# Patient Record
Sex: Female | Born: 1935
Health system: Southern US, Community
[De-identification: ages and names within clinical notes are randomized; demographics above are authoritative.]

## PROBLEM LIST (undated history)

## (undated) DIAGNOSIS — M8580 Other specified disorders of bone density and structure, unspecified site: Secondary | ICD-10-CM

## (undated) DIAGNOSIS — K219 Gastro-esophageal reflux disease without esophagitis: Secondary | ICD-10-CM

## (undated) DIAGNOSIS — Z85828 Personal history of other malignant neoplasm of skin: Secondary | ICD-10-CM

## (undated) DIAGNOSIS — M4800 Spinal stenosis, site unspecified: Secondary | ICD-10-CM

## (undated) DIAGNOSIS — K5792 Diverticulitis of intestine, part unspecified, without perforation or abscess without bleeding: Secondary | ICD-10-CM

## (undated) DIAGNOSIS — Z9289 Personal history of other medical treatment: Secondary | ICD-10-CM

## (undated) DIAGNOSIS — I639 Cerebral infarction, unspecified: Secondary | ICD-10-CM

## (undated) DIAGNOSIS — I10 Essential (primary) hypertension: Secondary | ICD-10-CM

## (undated) DIAGNOSIS — S72009A Fracture of unspecified part of neck of unspecified femur, initial encounter for closed fracture: Secondary | ICD-10-CM

## (undated) DIAGNOSIS — E785 Hyperlipidemia, unspecified: Secondary | ICD-10-CM

## (undated) DIAGNOSIS — H9190 Unspecified hearing loss, unspecified ear: Secondary | ICD-10-CM

## (undated) DIAGNOSIS — R001 Bradycardia, unspecified: Secondary | ICD-10-CM

## (undated) HISTORY — DX: Cerebral infarction, unspecified: I63.9

## (undated) HISTORY — DX: Fracture of unspecified part of neck of unspecified femur, initial encounter for closed fracture: S72.009A

## (undated) HISTORY — PX: LAPAROSCOPIC GASTRIC BANDING: SHX1100

## (undated) HISTORY — DX: Bradycardia, unspecified: R00.1

## (undated) HISTORY — DX: Diverticulitis of intestine, part unspecified, without perforation or abscess without bleeding: K57.92

## (undated) HISTORY — PX: TONSILLECTOMY AND ADENOIDECTOMY: SUR1326

## (undated) HISTORY — PX: ABDOMINAL HYSTERECTOMY: SHX81

## (undated) HISTORY — DX: Spinal stenosis, site unspecified: M48.00

## (undated) HISTORY — DX: Gastro-esophageal reflux disease without esophagitis: K21.9

## (undated) HISTORY — DX: Hyperlipidemia, unspecified: E78.5

## (undated) HISTORY — PX: CHOLECYSTECTOMY: SHX55

## (undated) HISTORY — PX: OTHER SURGICAL HISTORY: SHX169

## (undated) HISTORY — DX: Essential (primary) hypertension: I10

## (undated) HISTORY — DX: Unspecified hearing loss, unspecified ear: H91.90

## (undated) HISTORY — DX: Personal history of other malignant neoplasm of skin: Z85.828

## (undated) HISTORY — DX: Personal history of other medical treatment: Z92.89

## (undated) HISTORY — PX: APPENDECTOMY: SHX54

## (undated) HISTORY — DX: Other specified disorders of bone density and structure, unspecified site: M85.80

---

## 1997-09-13 DIAGNOSIS — I639 Cerebral infarction, unspecified: Secondary | ICD-10-CM

## 1997-09-13 HISTORY — DX: Cerebral infarction, unspecified: I63.9

## 2001-02-15 ENCOUNTER — Encounter (INDEPENDENT_AMBULATORY_CARE_PROVIDER_SITE_OTHER): Payer: Self-pay | Admitting: Specialist

## 2001-02-15 ENCOUNTER — Ambulatory Visit (HOSPITAL_COMMUNITY): Admission: RE | Admit: 2001-02-15 | Discharge: 2001-02-15 | Payer: Self-pay | Admitting: Gastroenterology

## 2003-01-23 ENCOUNTER — Inpatient Hospital Stay (HOSPITAL_COMMUNITY): Admission: EM | Admit: 2003-01-23 | Discharge: 2003-01-24 | Payer: Self-pay | Admitting: Emergency Medicine

## 2003-01-23 ENCOUNTER — Encounter: Payer: Self-pay | Admitting: *Deleted

## 2003-01-24 ENCOUNTER — Encounter: Payer: Self-pay | Admitting: *Deleted

## 2005-06-29 ENCOUNTER — Emergency Department (HOSPITAL_COMMUNITY): Admission: EM | Admit: 2005-06-29 | Discharge: 2005-06-29 | Payer: Self-pay | Admitting: Emergency Medicine

## 2005-10-13 ENCOUNTER — Inpatient Hospital Stay (HOSPITAL_COMMUNITY): Admission: EM | Admit: 2005-10-13 | Discharge: 2005-10-15 | Payer: Self-pay | Admitting: Emergency Medicine

## 2006-11-03 ENCOUNTER — Encounter: Payer: Self-pay | Admitting: Internal Medicine

## 2006-11-03 ENCOUNTER — Encounter: Admission: RE | Admit: 2006-11-03 | Discharge: 2006-11-03 | Payer: Self-pay | Admitting: Specialist

## 2006-11-10 ENCOUNTER — Encounter: Admission: RE | Admit: 2006-11-10 | Discharge: 2006-11-10 | Payer: Self-pay | Admitting: Specialist

## 2006-11-10 IMAGING — RF IR DG VERTEBROPLASTY FL
10 series · 10 of 10 positions shown · non-contrast
Comparison: none

CLINICAL DATA: 82 year old female with T12 osteoporotic compression fracture. 
 T12 VERTEBROPLASTY: 
 Operator:  PIINEDA, M.D.
 Anesthesia:  Moderate sedation was provided with Versed and Fentanyl.  The patient was given 1 gram Ancef for IV antibiotic prophylaxis. 
 Preoperative Diagnosis:  Osteoporotic compression fracture of T12.  The fracture was sustained with minimal trauma.  Of note, the patient has six lumbar type vertebral bodies.  I believe the last is a transitional S1 segment.   
 Post Operative Diagnosis:  Osteoporotic compression fracture of T12. 
 Procedure:  After obtaining informed written for the procedure and sedation, the patient was brought to Fluoroscopy.   She stated her pain had subsided some over the last week, but she continues to have pain with movement and sleeping which makes her hesitant to pursue activities.  
 She was positioned on the table prone.  The T12 vertebral body was localized.  The back was prepped and draped in the usual sterile fashion.  The left pedicle of T12 was localized.  The superficial soft tissues were anesthetized with 1% Lidocaine to the level of the pedicle.  A small skin incision was made.  A 13 gauge bone needle was then advanced through the pedicle to the anterior one-third of the vertebral body approaching the midline.  The cement was mixed to an appropriate viscosity.  The cement was then instilled via the pedicle needle in situ.  An excellent trabecular filling pattern was achieved.  There was filling across the midline.  The cement extends from the superior to inferior endplate on the left.  It does not reach the superior endplate to the same extent on the right.  There is minimal vascular extrusion anteriorly.  No significant extrusion is seen elsewhere.  
 The needle was then removed.  The patient remained prone for approximately 10 minutes before being rolled supine.  She was observed for an hour an a half prior to discharge in stable neurologic condition.

[Series 1: vertebro  plasty · 1 of 1 slices shown (1 of 10)]
[im 1/1]
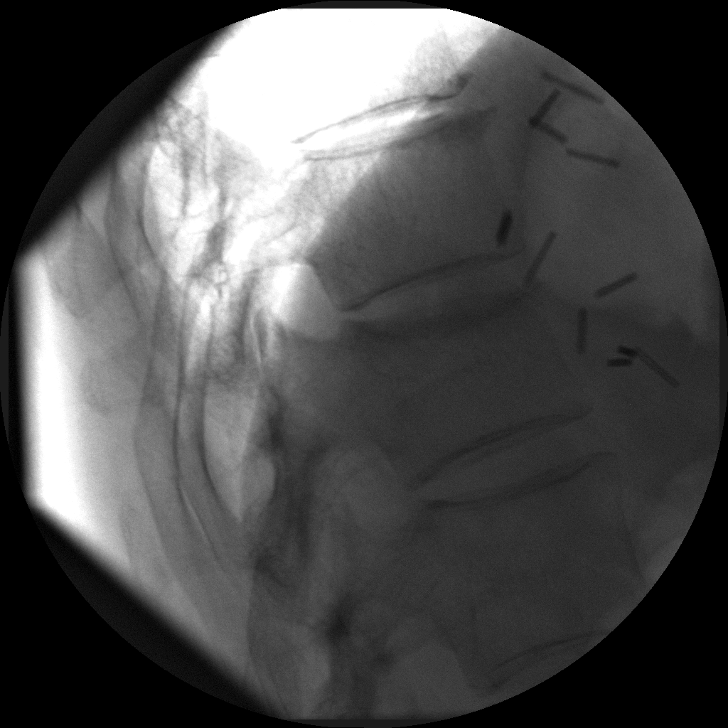

[Series 2: vertebro  plasty · 1 of 1 slices shown (2 of 10)]
[im 1/1]
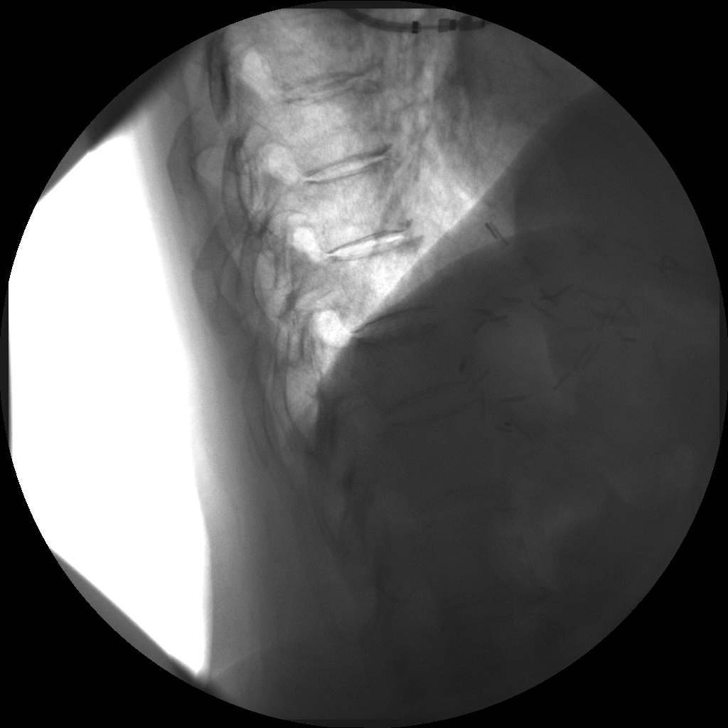

[Series 3: vertebro  plasty · 1 of 1 slices shown (3 of 10)]
[im 1/1]
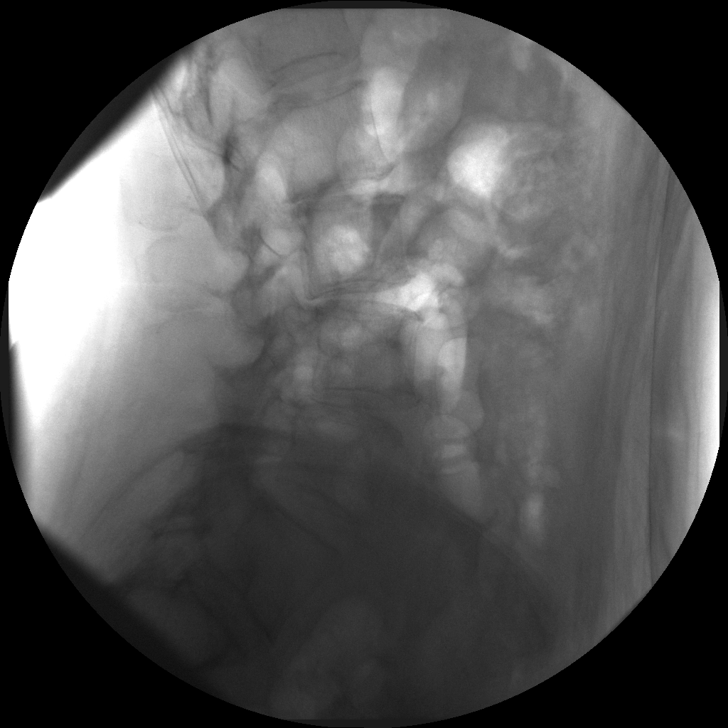

[Series 8: vertebro  plasty · 1 of 1 slices shown (4 of 10)]
[im 1/1]
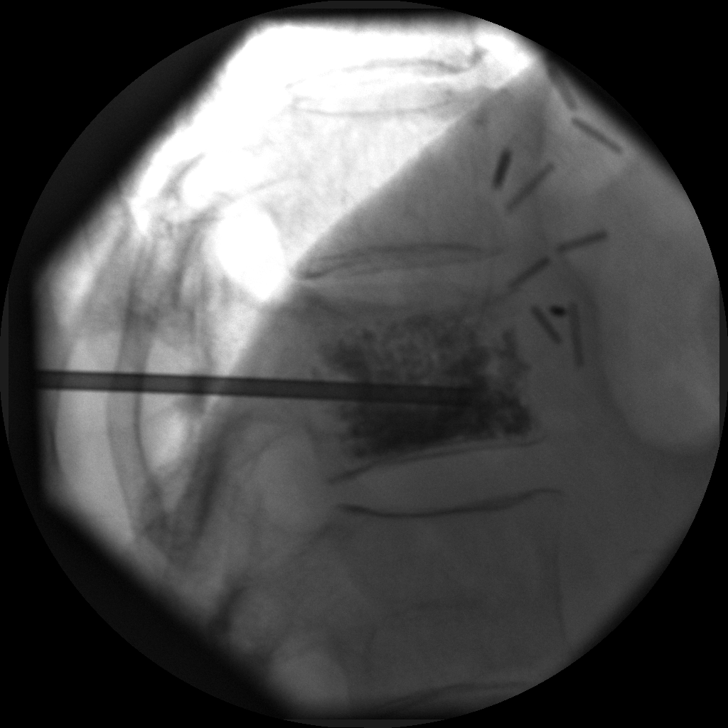

[Series 9: vertebro  plasty · 1 of 1 slices shown (5 of 10)]
[im 1/1]
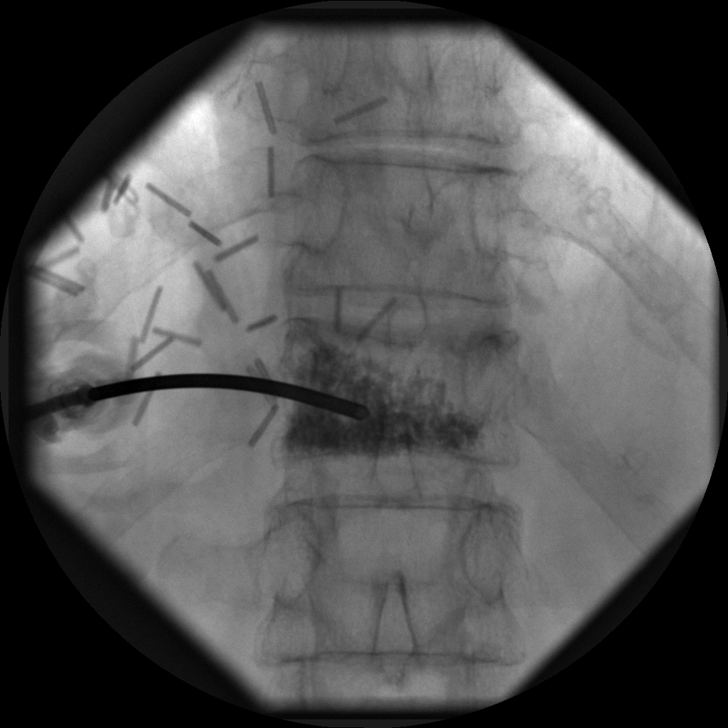

[Series 10: vertebro  plasty · 1 of 1 slices shown (6 of 10)]
[im 1/1]
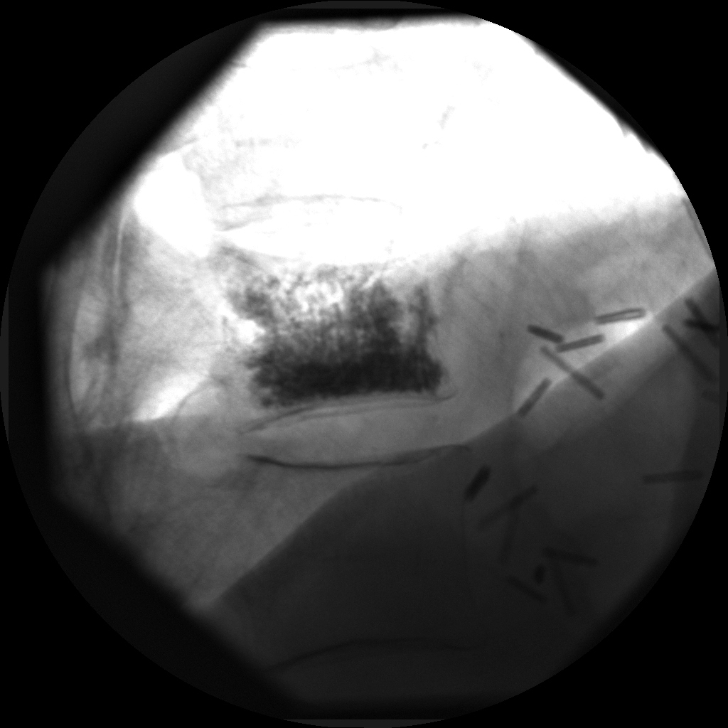

[Series 11: vertebro  plasty · 1 of 1 slices shown (7 of 10)]
[im 1/1]
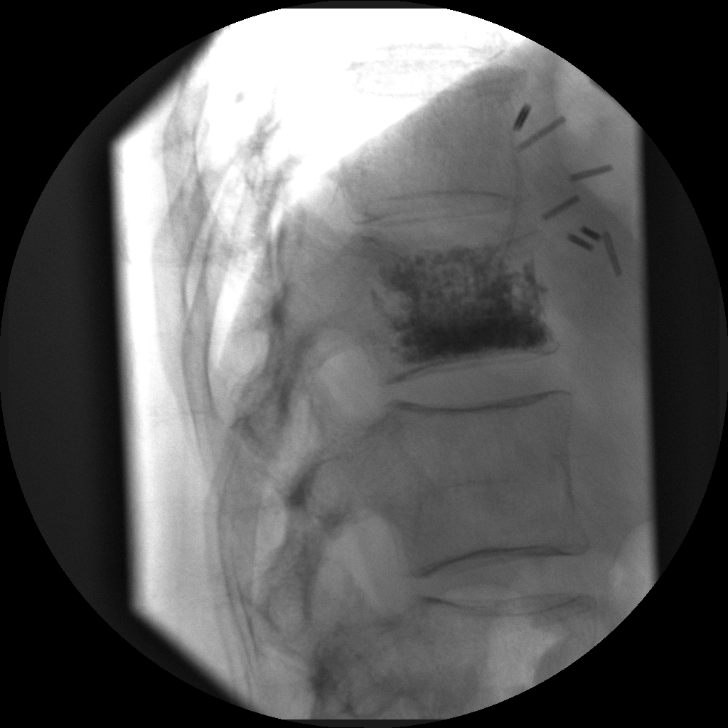

[Series 12: vertebro  plasty · 1 of 1 slices shown (8 of 10)]
[im 1/1]
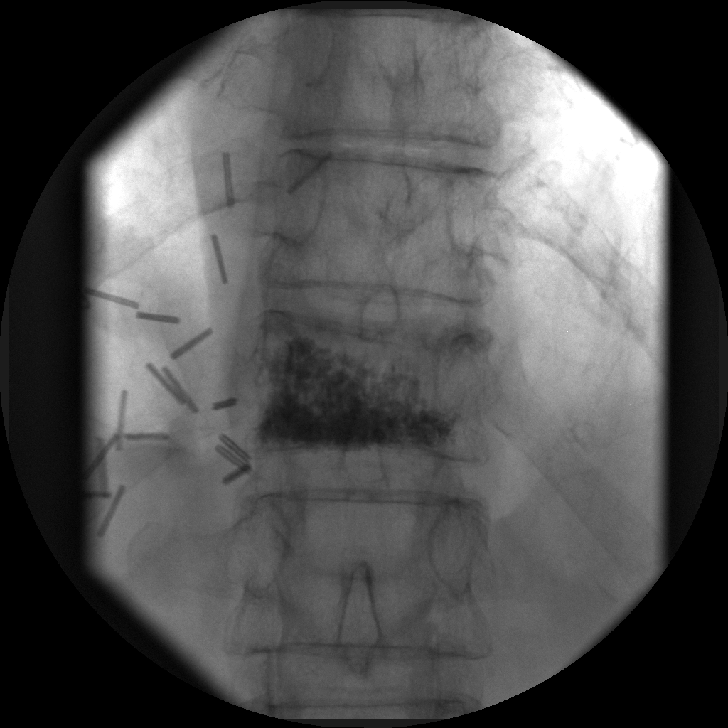

[Series 13: vertebro  plasty · 1 of 1 slices shown (9 of 10)]
[im 1/1]
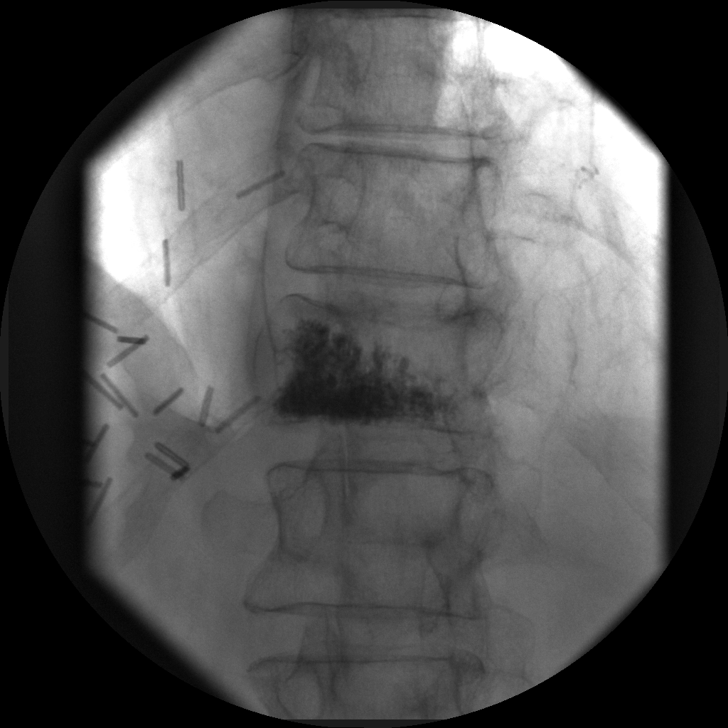

[Series 14: vertebro  plasty · 1 of 1 slices shown (10 of 10)]
[im 1/1]
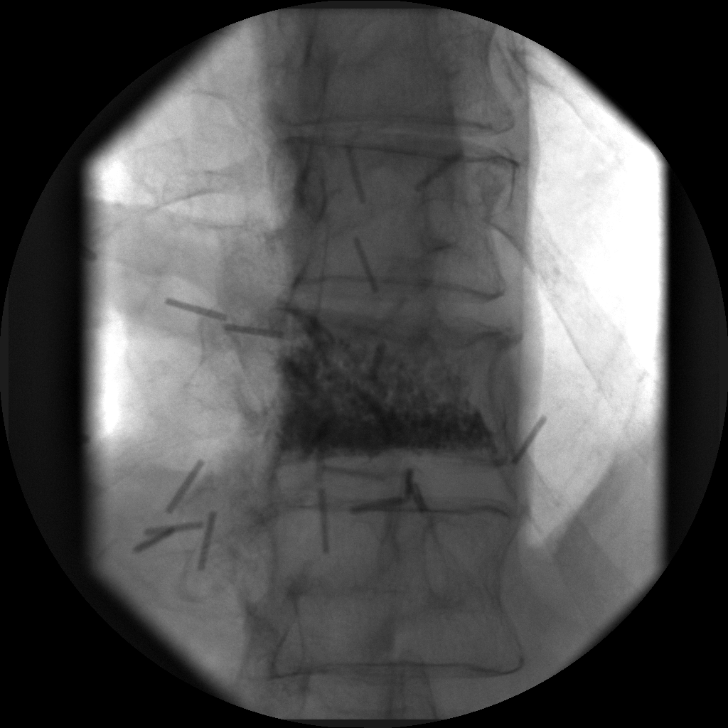

[10 of 10 positions shown; findings below may reference images not displayed]

IMPRESSION: 1.  Technically successful unipedicular vertebroplasty T12. 
 2.  The patient will return for follow-up in clinic in two weeks.

## 2006-11-17 ENCOUNTER — Encounter: Payer: Self-pay | Admitting: Internal Medicine

## 2006-11-17 ENCOUNTER — Inpatient Hospital Stay (HOSPITAL_COMMUNITY): Admission: EM | Admit: 2006-11-17 | Discharge: 2006-11-18 | Payer: Self-pay | Admitting: Emergency Medicine

## 2006-11-18 ENCOUNTER — Encounter: Payer: Self-pay | Admitting: Internal Medicine

## 2006-11-22 ENCOUNTER — Encounter: Admission: RE | Admit: 2006-11-22 | Discharge: 2006-11-22 | Payer: Self-pay | Admitting: Radiology

## 2009-01-21 ENCOUNTER — Encounter: Payer: Self-pay | Admitting: Internal Medicine

## 2009-01-28 ENCOUNTER — Encounter: Payer: Self-pay | Admitting: Internal Medicine

## 2009-08-27 ENCOUNTER — Ambulatory Visit: Payer: Self-pay | Admitting: Internal Medicine

## 2009-08-27 DIAGNOSIS — Z85828 Personal history of other malignant neoplasm of skin: Secondary | ICD-10-CM | POA: Insufficient documentation

## 2009-08-27 DIAGNOSIS — Z8679 Personal history of other diseases of the circulatory system: Secondary | ICD-10-CM | POA: Insufficient documentation

## 2009-08-27 DIAGNOSIS — M949 Disorder of cartilage, unspecified: Secondary | ICD-10-CM

## 2009-08-27 DIAGNOSIS — E785 Hyperlipidemia, unspecified: Secondary | ICD-10-CM

## 2009-08-27 DIAGNOSIS — Z8719 Personal history of other diseases of the digestive system: Secondary | ICD-10-CM | POA: Insufficient documentation

## 2009-08-27 DIAGNOSIS — R519 Headache, unspecified: Secondary | ICD-10-CM | POA: Insufficient documentation

## 2009-08-27 DIAGNOSIS — K219 Gastro-esophageal reflux disease without esophagitis: Secondary | ICD-10-CM

## 2009-08-27 DIAGNOSIS — M899 Disorder of bone, unspecified: Secondary | ICD-10-CM | POA: Insufficient documentation

## 2009-08-27 DIAGNOSIS — R51 Headache: Secondary | ICD-10-CM

## 2009-08-27 DIAGNOSIS — I10 Essential (primary) hypertension: Secondary | ICD-10-CM | POA: Insufficient documentation

## 2009-09-01 ENCOUNTER — Ambulatory Visit: Payer: Self-pay | Admitting: Internal Medicine

## 2009-09-07 LAB — CONVERTED CEMR LAB
ALT: 18 units/L (ref 0–35)
Alkaline Phosphatase: 33 units/L — ABNORMAL LOW (ref 39–117)
Bilirubin, Direct: 0.1 mg/dL (ref 0.0–0.3)
CO2: 29 meq/L (ref 19–32)
Calcium: 10 mg/dL (ref 8.4–10.5)
Cholesterol: 135 mg/dL (ref 0–200)
Creatinine, Ser: 0.7 mg/dL (ref 0.4–1.2)
Glucose, Bld: 106 mg/dL — ABNORMAL HIGH (ref 70–99)
HDL: 67.2 mg/dL (ref >39.00)
Hemoglobin: 13.5 g/dL (ref 12.0–15.0)
MCHC: 33.3 g/dL (ref 30.0–36.0)
RBC: 4.01 M/uL (ref 3.87–5.11)
RDW: 13.8 % (ref 11.5–14.6)
Sodium: 136 meq/L (ref 135–145)
TSH: 1.66 microintl units/mL (ref 0.35–5.50)
Total Bilirubin: 1 mg/dL (ref 0.3–1.2)
Total CHOL/HDL Ratio: 2
VLDL: 15.2 mg/dL (ref 0.0–40.0)
WBC: 7.6 10*3/uL (ref 4.5–10.5)

## 2009-09-08 ENCOUNTER — Encounter (INDEPENDENT_AMBULATORY_CARE_PROVIDER_SITE_OTHER): Payer: Self-pay | Admitting: *Deleted

## 2010-01-12 ENCOUNTER — Telehealth (INDEPENDENT_AMBULATORY_CARE_PROVIDER_SITE_OTHER): Payer: Self-pay | Admitting: *Deleted

## 2010-01-21 ENCOUNTER — Encounter: Payer: Self-pay | Admitting: Internal Medicine

## 2010-02-25 ENCOUNTER — Encounter: Payer: Self-pay | Admitting: Internal Medicine

## 2010-02-26 ENCOUNTER — Telehealth (INDEPENDENT_AMBULATORY_CARE_PROVIDER_SITE_OTHER): Payer: Self-pay | Admitting: *Deleted

## 2010-03-23 ENCOUNTER — Ambulatory Visit: Payer: Self-pay | Admitting: Internal Medicine

## 2010-03-23 DIAGNOSIS — M48061 Spinal stenosis, lumbar region without neurogenic claudication: Secondary | ICD-10-CM

## 2010-03-23 DIAGNOSIS — M545 Low back pain: Secondary | ICD-10-CM

## 2010-08-06 ENCOUNTER — Encounter: Payer: Self-pay | Admitting: Internal Medicine

## 2010-09-28 ENCOUNTER — Telehealth (INDEPENDENT_AMBULATORY_CARE_PROVIDER_SITE_OTHER): Payer: Self-pay | Admitting: *Deleted

## 2010-10-02 ENCOUNTER — Telehealth (INDEPENDENT_AMBULATORY_CARE_PROVIDER_SITE_OTHER): Payer: Self-pay | Admitting: *Deleted

## 2010-10-13 NOTE — Medication Information (Signed)
Summary: Nonadherence with Alendronate/BCBS  Nonadherence with Alendronate/BCBS   Imported By: Lanelle Bal 08/19/2010 14:42:40  _____________________________________________________________________  External Attachment:    Type:   Image     Comment:   External Document

## 2010-10-13 NOTE — Progress Notes (Signed)
Summary: NEEDS VIT D LEVEL CHECKED??  Phone Note Call from Patient Call back at [facility] Phone [phone]   Caller: Patient Summary of Call: PATIENT CAME IN TO CHECK IF SHE HAD HER VIT D LEVEL CHECKED WHEN SHE HAD HER LABS DONE MONTHS AGO----I DID NOT SEE THAT INFO LISTED ON HER LAB RESULTS SHEET---SHE WOULD LIKE IT CHECKED BECAUSE SHE NEVER GOES OUTSIDE BECAUSE OF PAST SKIN CANCERS--SHE DOES TAKE CALCIUM, BUT DID NOT TELL ME THE DOSE  SHE CAN GET IT DRAWN AT [facility] IF SHE GETS AN ORDER OR SHE CAN COME HERE FOR A LAB APPOINTMENT---WHAT DOES DR HOPPER WANT HER TO DO?  PLEASE CALL HER AT [phone] Initial call taken by: Jerolyn Shin,  Jan 12, 2010 11:50 AM  Follow-up for Phone Call        lab order mailed to pt at pt request to have done at [facility].............Marland KitchenFelecia Deloach CMA  Jan 12, 2010 2:55 PM     New/Updated Medications: * LAB ORDER Vitamin D-733.90 Prescriptions: LAB ORDER Vitamin D-733.90  #1 x 0   Entered by:   Jeremy Johann CMA   Authorized by:   [name] MD   Signed by:   Jeremy Johann CMA on 01/12/2010   Method used:   Print then Mail to Patient   RxID:   [id number]   Appended Document: NEEDS VIT D LEVEL CHECKED?? Vitamin D level was 37 on 01/21/2010; minimal goal = > 40. Please add 1000 International Units vit D 3 to present daily dose.  Appended Document: NEEDS VIT D LEVEL CHECKED?? Append mailed, with labs (lab sent to be scanned)

## 2010-10-13 NOTE — Progress Notes (Signed)
Summary: Can Celebrex be taken w/ Plavix   Phone Note From Other Clinic Call back at [phone]   Caller: Ann- Dr. Simonne Come Summary of Call: patient has been diagnosed w/ spinal stenosis and they are wanted to treat patient with Celebrex and wanted to know if this is ok since patient is currently on Plavix  Initial call taken by: Doristine Devoid,  February 26, 2010 10:44 AM  Follow-up for Phone Call        I called Ann back at Dr.Aplington's office and informed them: Patient stopped by yesterday and ask Dr.Hopper this question. Dr.Hopper ok'd patient taking meds together and indicated patient should take Celebrex infrequently as possible due to GI Risk Follow-up by: Shonna Chock,  February 26, 2010 12:33 PM

## 2010-10-13 NOTE — Assessment & Plan Note (Signed)
Summary: DISCUSS XRAYS FROM [facility]//KN   Vital Signs:  Patient profile:   77 year old female Weight:      141.8 pounds BMI:     27.79 Pulse rate:   76 / minute Resp:     17 per minute BP sitting:   146 / 82  (left arm) Cuff size:   large  Vitals Entered By: Shonna Chock (March 23, 2010 10:17 AM) CC: Follow-up visit: from [facility] Comments REVIEWED MED LIST, PATIENT AGREED DOSE AND INSTRUCTION CORRECT    CC:  Follow-up visit: from [facility].  History of Present Illness: She has Spinal Stenosis, Spondylolithesis, & Scoliosis as per Xrays @ [facility]. Surgery not considered by patient. Codeine causes N&V. Generic for Robaxin is effective. Meloxicam was stopped by her due to long term risks Freight forwarder reviewed).  Allergies: 1)  ! Codeine  Review of Systems GU:  Denies incontinence. MS:  Complains of low back pain. Neuro:  Denies disturbances in coordination, numbness, poor balance, tingling, and weakness.  Physical Exam  General:  Appears younger than age,well-nourished,in no acute distress; alert,appropriate and cooperative throughout examination Msk:  Sat up & lay down w/o help Extremities:  No clubbing, cyanosis, edema. Mild OA hand changes. Neg SLR Neurologic:  alert & oriented X3, strength normal in all extremities, gait  deliberate but normal, and DTRs symmetrical and normal.   Skin:  Intact without suspicious lesions or rashes   Impression & Recommendations:  Problem # 1:  LOW BACK PAIN SYNDROME (ICD-724.2) due to #2 & scoliosis & spondylolithesis Her updated medication list for this problem includes:    Meloxicam 7.5 Mg Tabs (Meloxicam) .Marland Kitchen... 1 two times a day as needed headache    Methocarbamol 500 Mg Tabs (Methocarbamol) .Marland Kitchen... 1 by mouth every 8 hours as needed  Problem # 2:  SPINAL STENOSIS, LUMBAR (ICD-724.02)  Complete Medication List: 1)  Plavix 75 Mg Tabs (Clopidogrel bisulfate) .... Take 1 tab once daily 2)  Simvastatin 20 Mg Tabs  (Simvastatin) .... Take 1 tab once daily 3)  Norvasc 5 Mg Tabs (Amlodipine besylate) .... Take 1 tab once daily 4)  Fosamax 70 Mg Tabs (Alendronate sodium) .... Take 1 takw once weekly 5)  Fish Oil  .... Take 1 once daily 6)  Multi-vitamin  .... Take 1 once daily 7)  Icaps Caps (Multiple vitamins-minerals) .... Take 1 two times a day 8)  Meloxicam 7.5 Mg Tabs (Meloxicam) .Marland Kitchen.. 1 two times a day as needed headache 9)  Losartan Potassium 100 Mg Tabs (Losartan potassium) .Marland Kitchen.. 1 once daily in place of benicar 10)  Methocarbamol 500 Mg Tabs (Methocarbamol) .Marland Kitchen.. 1 by mouth every 8 hours as needed 11)  Fentanyl 12.5 Mcg/hr Pt72 (fentanyl)  .Marland Kitchen.. 1 patch every 3 days as needed  Patient Instructions: 1)  Resume water aerobics & Bends , Bones & balance program. Fill  the Fentanyl patch if pain worsens. Prescriptions: FENTANYL 12.5 MCG/HR PT72 (FENTANYL) 1 patch every 3 days as needed  #3 x 0   Entered and Authorized by:   [name] MD   Signed by:   [name] MD on 03/23/2010   Method used:   Print then Give to Patient   RxID:   [id number]

## 2010-10-15 NOTE — Progress Notes (Signed)
Summary: Refill Request  Phone Note Refill Request Message from:  Pharmacy on October 02, 2010 2:26 PM  Refills Requested: Medication #1:  NORVASC 5 MG TABS take 1 tab once daily   Dosage confirmed as above?Dosage Confirmed   Brand Name Necessary? No   Supply Requested: 90   Last Refilled: 08/27/2010 [facility]  Next Appointment Scheduled: 2.7.12 Initial call taken by: Harold Barban,  October 02, 2010 2:26 PM    Prescriptions: NORVASC 5 MG TABS (AMLODIPINE BESYLATE) take 1 tab once daily  #90 x 0   Entered by:   Army Fossa CMA   Authorized by:   [name] MD   Signed by:   Army Fossa CMA on 10/02/2010   Method used:   Telephoned to ...         RxID:   [id number]

## 2010-10-15 NOTE — Progress Notes (Signed)
Summary: 3 prescriptions to [facility]  Phone Note Refill Request Message from:  Fax from Pharmacy on September 28, 2010 4:01 PM  Refills Requested: Medication #1:  SIMVASTATIN 20 MG TABS take 1 tab once daily  Medication #2:  LOSARTAN POTASSIUM 100 MG TABS 1 once daily in place of Benicar  Medication #3:  PLAVIX 75 MG TABS take 1 tab once daily [facility],    phone - [phone]  (if calling--use [phone]),   fax - [phone]  Next Appointment Scheduled: Tues 2/7   [facility] Initial call taken by: Jerolyn Shin,  September 28, 2010 4:04 PM  Follow-up for Phone Call        RX's faxed to: [phone]  Follow-up by: Shonna Chock CMA,  September 29, 2010 1:49 PM    Prescriptions: LOSARTAN POTASSIUM 100 MG TABS (LOSARTAN POTASSIUM) 1 once daily in place of Benicar  #90 x 0   Entered by:   Shonna Chock CMA   Authorized by:   [name] MD   Signed by:   Shonna Chock CMA on 09/29/2010   Method used:   Print then Give to Patient   RxID:   [id number] SIMVASTATIN 20 MG TABS (SIMVASTATIN) take 1 tab once daily  #90 x 0   Entered by:   Shonna Chock CMA   Authorized by:   [name] MD   Signed by:   Shonna Chock CMA on 09/29/2010   Method used:   Print then Give to Patient   RxID:   [id number] PLAVIX 75 MG TABS (CLOPIDOGREL BISULFATE) take 1 tab once daily  #90 x 0   Entered by:   Shonna Chock CMA   Authorized by:   [name] MD   Signed by:   Shonna Chock CMA on 09/29/2010   Method used:   Print then Give to Patient   RxID:   [id number]

## 2010-10-16 NOTE — Letter (Signed)
Summary: [facility]  [facility]   Imported By: Lanelle Bal 03/25/2010 10:47:13  _____________________________________________________________________  External Attachment:    Type:   Image     Comment:   External Document

## 2010-10-20 ENCOUNTER — Encounter: Payer: Self-pay | Admitting: Internal Medicine

## 2010-10-20 ENCOUNTER — Encounter (INDEPENDENT_AMBULATORY_CARE_PROVIDER_SITE_OTHER): Payer: Medicare Other | Admitting: Internal Medicine

## 2010-10-20 DIAGNOSIS — D489 Neoplasm of uncertain behavior, unspecified: Secondary | ICD-10-CM | POA: Insufficient documentation

## 2010-10-20 DIAGNOSIS — I10 Essential (primary) hypertension: Secondary | ICD-10-CM

## 2010-10-20 DIAGNOSIS — Z0000 Encounter for general adult medical examination without abnormal findings: Secondary | ICD-10-CM

## 2010-10-21 ENCOUNTER — Other Ambulatory Visit (INDEPENDENT_AMBULATORY_CARE_PROVIDER_SITE_OTHER): Payer: Medicare Other

## 2010-10-21 ENCOUNTER — Other Ambulatory Visit: Payer: Self-pay | Admitting: Internal Medicine

## 2010-10-21 ENCOUNTER — Encounter (INDEPENDENT_AMBULATORY_CARE_PROVIDER_SITE_OTHER): Payer: Self-pay | Admitting: *Deleted

## 2010-10-21 ENCOUNTER — Encounter: Payer: Self-pay | Admitting: Internal Medicine

## 2010-10-21 DIAGNOSIS — R7309 Other abnormal glucose: Secondary | ICD-10-CM

## 2010-10-21 DIAGNOSIS — M899 Disorder of bone, unspecified: Secondary | ICD-10-CM

## 2010-10-21 DIAGNOSIS — E785 Hyperlipidemia, unspecified: Secondary | ICD-10-CM

## 2010-10-21 DIAGNOSIS — M949 Disorder of cartilage, unspecified: Secondary | ICD-10-CM

## 2010-10-21 DIAGNOSIS — I10 Essential (primary) hypertension: Secondary | ICD-10-CM

## 2010-10-21 DIAGNOSIS — K219 Gastro-esophageal reflux disease without esophagitis: Secondary | ICD-10-CM

## 2010-10-21 LAB — BASIC METABOLIC PANEL
BUN: 14 mg/dL (ref 6–23)
CO2: 27 mEq/L (ref 19–32)
Calcium: 9.3 mg/dL (ref 8.4–10.5)
Chloride: 103 mEq/L (ref 96–112)
Potassium: 4.3 mEq/L (ref 3.5–5.1)
Sodium: 140 mEq/L (ref 135–145)

## 2010-10-21 LAB — HEPATIC FUNCTION PANEL: AST: 23 U/L (ref 0–37)

## 2010-10-21 LAB — CBC WITH DIFFERENTIAL/PLATELET
Eosinophils Absolute: 0.3 10*3/uL (ref 0.0–0.7)
Eosinophils Relative: 5.9 % — ABNORMAL HIGH (ref 0.0–5.0)
Lymphs Abs: 1.9 10*3/uL (ref 0.7–4.0)
MCHC: 33.8 g/dL (ref 30.0–36.0)
Monocytes Absolute: 0.6 10*3/uL (ref 0.1–1.0)
Monocytes Relative: 11.3 % (ref 3.0–12.0)
Neutro Abs: 2.7 10*3/uL (ref 1.4–7.7)
Neutrophils Relative %: 48.2 % (ref 43.0–77.0)
RBC: 3.96 Mil/uL (ref 3.87–5.11)
WBC: 5.7 10*3/uL (ref 4.5–10.5)

## 2010-10-21 LAB — LIPID PANEL: VLDL: 9.8 mg/dL (ref 0.0–40.0)

## 2010-10-26 LAB — CONVERTED CEMR LAB: Vit D, 25-Hydroxy: 41 ng/mL (ref 30–89)

## 2010-10-29 NOTE — Assessment & Plan Note (Signed)
Summary: yearly check/kn   Vital Signs:  Patient profile:   77 year old female Height:      60 inches Weight:      138 pounds BMI:     27.05 O2 Sat:      98 % on Room air Temp:     98.1 degrees F oral Pulse rate:   75 / minute Resp:     18 per minute BP sitting:   138 / 88  (left arm)  Vitals Entered By: Jeremy Johann CMA (October 20, 2010 12:57 PM)  O2 Flow:  Room air CC: yearly, not fasting   CC:  yearly and not fasting.  History of Present Illness: Here for Medicare AWV: 1.Risk factors based on Past M, S, F history:see Diagnoses; chart updated 2.Physical Activities:walking , exercise class 3X/week 30-60 min  3.Depression/mood: some depression since husband's ; meds declined. Strong spiritual faith Dispensing optician) 4.Hearing: hearing decreased bilaterally; Audiology referral  discussed 5.ADL's: Fall Risk: no issues 7.Home Safety: in retirement community 8.Height, weight, &visual acuity:seeing Dr Hazle Quant; see VS 9.Counseling: POA & Living Will in place 10.Labs ordered based on risk factors: see Orders 11.Referral Coordination: Derm F/U discussed. Offered Neuro referral to reassess need for Plavix due to expense 12. Care Plan:see instructions 13. Cognitive Assessment: Oriented X 3 ; memory or recall excellent  ;"WORLD " spelled backwards ; mood & affect normal.    Hyperlipidemia Follow-Up: She  denies muscle aches, GI upset, abdominal pain, flushing, itching, constipation, diarrhea, and fatigue.  The patient denies the following symptoms: chest pain/pressure, exercise intolerance, dypsnea, palpitations, syncope, and pedal edema.  Compliance with medications (by patient report) has been near 100%.  Dietary compliance has been good.  Adjunctive measures currently used by the patient include fiber and fish oil supplements.     Hypertension Follow-Up:  The patient reports headaches due to stress, but denies lightheadedness and urinary frequency.  Compliance with medications (by patient  report) has been near 100%.  Adjunctive measures currently used by the patient include salt restriction.    Preventive Screening-Counseling & Management  Alcohol-Tobacco     Alcohol drinks/day: 1-2     Smoking Status: never  Caffeine-Diet-Exercise     Caffeine use/day: 2 cups/ day; 2 tea/ day  Hep-HIV-STD-Contraception     Dental Visit-last 6 months yes     Sun Exposure-Excessive: no  Safety-Violence-Falls     Seat Belt Use: yes      Blood Transfusions:  no.        Travel History:  [place]  decades ago.    Current Medications (verified): 1)  Plavix 75 Mg Tabs (Clopidogrel Bisulfate) .... Take 1 Tab Once Daily 2)  Simvastatin 20 Mg Tabs (Simvastatin) .... Take 1 Tab Once Daily 3)  Norvasc 5 Mg Tabs (Amlodipine Besylate) .... Take 1 Tab Once Daily 4)  Fish Oil .... Take 1 Once Daily 5)  Multi-Vitamin .... Take 1 Once Daily 6)  Icaps  Caps (Multiple Vitamins-Minerals) .... Take 1 Two Times A Day 7)  Losartan Potassium 100 Mg Tabs (Losartan Potassium) .Marland Kitchen.. 1 Once Daily in Place of Benicar  Allergies (verified): 1)  ! Codeine  Past History:  Past Medical History: Diverticulitis,  PMH  of GERD Hyperlipidemia Hypertension Transient ischemic attack, PMH  of, 1996, Plavix initiated in [place], [place] Osteopenia: T score -1.4 @ [facility] (01/28/2009); S/P Fosamax for > 5 years Skin cancer of lip,PMH of , Dr Nicholas Lose  Past Surgical History: Hysterectomy & BSO 1991 Nissen Fundoplication 1997  Appendectomy Cataract extraction bilaterally Cholecystectomy Femoral  herniorrhaphy Tonsillectomy Thumb surgery bilaterally; Colonoscopy : Tics, Dr Bernette Redbird  Family History: Father: HTN,CAD,Tuberculosis Mother: CVA, HTN Siblings: dementia, COAD,CAD  Social History: Retired Never Smoked Alcohol use-yes;1-  2  / drink Regular exercise-yes Widow Caffeine use/day:  2 cups/ day; 2 tea/ day Dental Care w/in 6 mos.:  yes Sun Exposure-Excessive:  no Seat Belt Use:  yes Blood  Transfusions:  no  Review of Systems       The patient complains of suspicious skin lesions.  The patient denies anorexia, fever, weight loss, weight gain, vision loss, decreased hearing, hoarseness, prolonged cough, hemoptysis, melena, hematochezia, severe indigestion/heartburn, hematuria, unusual weight change, abnormal bleeding, enlarged lymph nodes, and angioedema.         Recurring lesion L index finger  Physical Exam  General:  Appears younger than age,well-nourished,in no acute distress; alert,appropriate and cooperative throughout examination Head:  Normocephalic and atraumatic without obvious abnormalities. Eyes:  No corneal or conjunctival inflammation noted.Perrla. Funduscopic exam benign, without hemorrhages, exudates or papilledema.  Ears:  External ear exam shows no significant lesions or deformities.  Otoscopic examination reveals  wax bilaterally. Hearing is grossly abnormal bilaterally. Nose:  External nasal examination shows no deformity or inflammation. Nasal mucosa are pink and moist without lesions or exudates. Mouth:  Oral mucosa and oropharynx without lesions or exudates.  Teeth in good repair. Neck:  No deformities, masses, or tenderness noted. Lungs:  Normal respiratory effort, chest expands symmetrically. Lungs are clear to auscultation, no crackles or wheezes. Heart:  normal rate, regular rhythm, no gallop, no rub, no JVD, no HJR, and grade 1 /6 systolic murmur.   Abdomen:  Bowel sounds positive,abdomen soft and non-tender without masses, organomegaly or hernias noted. Well healed op scar Msk:  Lordosis upper spine  Pulses:  R and L carotid,radial,dorsalis pedis and posterior tibial pulses are full and equal bilaterally Extremities:  No clubbing, cyanosis, edema, or deformity noted with normal full range of motion of all joints.   Crepitus of knees.  Neurologic:  alert & oriented X3 and DTRs symmetrical and normal.   Skin:  Scattered keratoses & solar  changes Cervical Nodes:  No lymphadenopathy noted Axillary Nodes:  No palpable lymphadenopathy Psych:  memory intact for recent and remote, normally interactive, and good eye contact.     Impression & Recommendations:  Problem # 1:  PREVENTIVE HEALTH CARE (ICD-V70.0)  Orders: Medicare -1st Annual Wellness Visit [phone])  Problem # 2:  HYPERTENSION (ICD-401.9)  Her updated medication list for this problem includes:    Norvasc 5 Mg Tabs (Amlodipine besylate) .Marland Kitchen... Take 1 tab once daily    Losartan Potassium 100 Mg Tabs (Losartan potassium) .Marland Kitchen... 1 once daily in place of benicar  Orders: EKG w/ Interpretation (93000)  Problem # 3:  HYPERLIPIDEMIA (ICD-272.4)  Her updated medication list for this problem includes:    Simvastatin 20 Mg Tabs (Simvastatin) .Marland Kitchen... Take 1 tab once daily  Problem # 4:  NEOPLASM, UNCERTAIN BEHAVIOR (ICD-238.9) L index  finger  Problem # 5:  SKIN CANCER, HX OF (ICD-V10.83) PMH of  Problem # 6:  GERD (ICD-530.81)  Problem # 7:  OSTEOPENIA (ICD-733.90)  The following medications were removed from the medication list:    Fosamax 70 Mg Tabs (Alendronate sodium) .Marland Kitchen... Take 1 takw once weekly  Complete Medication List: 1)  Plavix 75 Mg Tabs (Clopidogrel bisulfate) .... Take 1 tab once daily 2)  Simvastatin 20 Mg Tabs (Simvastatin) .... Take 1 tab  once daily 3)  Norvasc 5 Mg Tabs (Amlodipine besylate) .... Take 1 tab once daily 4)  Fish Oil  .... Take 1 once daily 5)  Multi-vitamin  .... Take 1 once daily 6)  Icaps Caps (Multiple vitamins-minerals) .... Take 1 two times a day 7)  Losartan Potassium 100 Mg Tabs (Losartan potassium) .Marland Kitchen.. 1 once daily in place of benicar  Patient Instructions: 1)  Please  consider referrals as discussed.Please schedule fasting labs in am (Codes:  272.4, 401.9,733.90,530.81):Vitamin D level; 2)  BMP ; 3)  Hepatic Panel ; 4)  Lipid Panel ; 5)  TSH ; 6)  CBC w/ Diff.   Orders Added: 1)  Medicare -1st Annual Wellness  Visit [G0438] 2)  Est. Patient Level III [11914] 3)  EKG w/ Interpretation [93000]

## 2010-11-09 ENCOUNTER — Telehealth: Payer: Self-pay | Admitting: Internal Medicine

## 2010-11-09 DIAGNOSIS — H9190 Unspecified hearing loss, unspecified ear: Secondary | ICD-10-CM | POA: Insufficient documentation

## 2010-11-19 NOTE — Progress Notes (Signed)
Summary: 2 questions  Phone Note Call from Patient   Caller: Patient Summary of Call: 1) patient has decided to get off the Plavix--what does she do next??  2) would like to see an "ear" doctor---her husband saw Dr Kelli Churn and she liked him--please set up a referral for her Initial call taken by: Jerolyn Shin,  November 09, 2010 4:42 PM  Follow-up for Phone Call        I called the patient for more info and she notes that she was put on Plavix by MD in [place] about 14 years ago. Patient states that [facility] thinks she does not need to be need it. She states that she needs to see an ear due to not hearing well. Please advise. Follow-up by: Lucious Groves CMA,  November 09, 2010 5:08 PM  Additional Follow-up for Phone Call Additional follow up Details #1::        Plavix was Rxed for TIA ; stopping it would more likely than not increase risk of recurrence. Reason for ENT referral ? . If it is for hearing evaluation , referral would be to Dr Lisabeth Pick Audiologist, Dr Dossie Arbour Additional Follow-up by: [name] MD,  November 09, 2010 5:22 PM  New Problems: UNSPECIFIED HEARING LOSS (ICD-389.9)   Additional Follow-up for Phone Call Additional follow up Details #2::    Referral entered. Left message on machine to call back to office. Lucious Groves CMA  November 10, 2010 8:59 AM  Patient notified. Lucious Groves CMA  November 10, 2010 10:05 AM    New Problems: UNSPECIFIED HEARING LOSS (ICD-389.9)

## 2011-01-06 ENCOUNTER — Other Ambulatory Visit: Payer: Self-pay | Admitting: *Deleted

## 2011-01-07 ENCOUNTER — Other Ambulatory Visit: Payer: Self-pay | Admitting: *Deleted

## 2011-01-07 MED ORDER — CLOPIDOGREL BISULFATE 75 MG PO TABS: 75.0000 mg | ORAL_TABLET | Freq: Every day | ORAL | Status: DC

## 2011-01-07 MED ORDER — SIMVASTATIN 20 MG PO TABS: 20.0000 mg | ORAL_TABLET | Freq: Every evening | ORAL | Status: DC

## 2011-01-07 MED ORDER — LOSARTAN POTASSIUM 100 MG PO TABS: 100.0000 mg | ORAL_TABLET | Freq: Every day | ORAL | Status: DC

## 2011-01-29 NOTE — H&P (Signed)
NAMEYUDITH, NORLANDER                ACCOUNT NO.:  [id number]   MEDICAL RECORD NO.:  [id number]          PATIENT TYPE:  INP   LOCATION:  0103                         FACILITY:  [facility]   PHYSICIAN:  Corinna L. Lendell Caprice, MDDATE OF BIRTH:  March 22, 1925   DATE OF ADMISSION:  10/13/2005  DATE OF DISCHARGE:                                HISTORY & PHYSICAL   CHIEF COMPLAINT:  Fever and diarrhea.   HISTORY OF PRESENT ILLNESS:  Ms. Hazell is a 77 year old white female  previously very independent and healthy, patient of Dr. Idell Pickles, who was seen  in Dr. Fredirick Maudlin office and sent to the emergency room. She started getting  sick this morning. She had some retching as well as diarrhea. She has a  fever and was sleeping all day. She was unable to get out of bed on her own.  She was given Phenergan in Dr. Fredirick Maudlin office. According to the daughter,  she had a headache as well this morning but no stiff neck. No rash and no  sick contacts. She had a flu vaccine this year and she can provide no  history of whatsoever currently as she is very somnolent.   PAST MEDICAL HISTORY:  1.  Hypertension.  2.  Hyperlipidemia.  3.  Gastroesophageal reflux disease.  4.  Laparoscopic Nissen fundoplication.  5.  History of reactive hypoglycemia.  6.  History of TIA.   MEDICATIONS:  1.  Plavix 75 milligrams a day.  2.  Zocor 20 milligrams a day.  3.  Norvasc 5 milligrams a day.  4.  Benicar/hydrochlorothiazide daily.   SOCIAL HISTORY:  She is married. She does not drink or smoke.   FAMILY HISTORY:  Noncontributory.   REVIEW OF SYSTEMS:  Unable to obtain as the patient is too somnolent.   PHYSICAL EXAMINATION:  VITAL SIGNS:  Temperature is 102.8, pulse 96,  respiratory rate 18. Oxygen saturation 97%.  GENERAL:  The patient is a very somnolent white female who will open her  eyes only briefly to voice but will not really answer questions or follow  commands.  HEENT:  Normocephalic and atraumatic. Pupils are  equal, round, and reactive.  Sclerae are nonicteric. She has dry mucus membranes. Oropharynx is without  exudates. There is some slight erythema.  NECK:  No lymphadenopathy. She appears to have some stiffness.  LUNGS:  Clear to auscultation with poor inspiratory effort. No wheezes,  rhonchi or rales.  CARDIOVASCULAR:  Regular rate and rhythm without murmurs, gallops, or rubs.  ABDOMEN:  Normal sinus rhythm, soft, nontender, and nondistended.  GENITOURINARY:  Deferred.  RECTAL:  Deferred.  EXTREMITIES:  No clubbing, cyanosis, or edema.  SKIN:  No rash.  NEUROLOGICAL:  The patient is very somnolent but otherwise has no obvious  focal deficits.   LABORATORY DATA:  UA shows negative nitrites, negative leukocyte esterase,  moderate blood, 3 to 6 red cells, greater than 80 ketones, negative protein.  Complete metabolic panel significant for a sodium of 127, glucose of 153.  Otherwise fairly unremarkable. CBC is significant for a white blood cell  count of 10,700 with 93%  neutrophils, 4% lymphocytes. Chest x-ray shows  possible right upper lobe infiltrate.   ASSESSMENT/PLAN:  1.  Fever with altered mental status:  Consider gastroenteritis. Also      consider developing pneumonia, although the daughter reports no cough.      Also with her altered mental status and reported headache earlier as      well as what appears to be meningismus today, she may need a lumbar      puncture. I will get a CT of the brain, give Rocephin 2 gm IV q.12h. I      am also going to order a repeat chest x-ray for the morning. If her      chest x-ray shows an obvious infiltrate, we can adjust her antibiotics.      If, however, the patient's encephalopathy does not improve and there is      no other explanation, she may need a lumbar puncture. I will also order      stool studies to rule out infectious diarrhea. I will also order a flu      swab.  2.  Dehydration:  She will get IV fluids.  3.  Hyponatremia:  This  will be monitored. Suspect secondary to dehydration      and acute illness.  4.  History of hypertension:  I will hold her medications.  5.  History of transient ischemic attack.  6.  Hyperlipidemia.  7.  Gastroesophageal reflux disease, status post Nissen fundoplication.  8.  History of reactive hypoglycemia. According to Dr. Danella Penton note she is      supposed to be on a carbohydrate modified diet.  9.  Nausea, vomiting, diarrhea.  10. Hyperglycemia:  She has no history of diabetes. I will check a      hemoglobin A1c.      Corinna L. Lendell Caprice, MD  Electronically Signed     CLS/MEDQ  D:  10/13/2005  T:  10/13/2005  Job:  [id number]   cc:   Dellis Anes. Idell Pickles, M.D.  Fax: [phone]

## 2011-01-29 NOTE — Procedures (Signed)
[facility]  Patient:    Christine Lyons, Christine Lyons                       MRN: [id number] Proc. Date: 02/15/01 Adm. Date:  [id number] Attending:  Rich Brave CC:         Dellis Anes. Idell Pickles, M.D.   Procedure Report  PROCEDURE:  Colonoscopy with biopsy.  ENDOSCOPIST:  Florencia Reasons, M.D.  INDICATIONS:  A 77 year old female for screening for colon cancer who also has persistent diarrhea.  FINDINGS:  Scattered pancolonic diverticulosis.  No source of diarrhea endoscopically evident.  DESCRIPTION OF PROCEDURE:  The nature, purpose, and risks of the procedure had been discussed with the patient who provided written consent.  Sedation was fentanyl 62.5 mcg and Versed 7 mg IV without arrhythmias or desaturation. The Olympus adjustable-tension pediatric video colonoscope was advanced without too much difficulty through a somewhat angulated colon to the cecum and for a moderate distance in a normal-appearing terminal ileum.  Ileal biopsies were obtained, and pullback was performed.  The quality of he prep was excellent, and it is felt that all areas were well seen.  There was scattered diverticular disease throughout the colon, but no polyps, cancer, colitis or vascular malformations were seen.  Random mucosal biopsies were obtained along the length of the colon.  The patient tolerated the procedure well, and there were no apparent complications.  IMPRESSION:  Extensive diverticulosis, otherwise normal exam.  PLAN:  Await pathology on random mucosal biopsies. DD:  02/15/01 TD:  02/15/01 Job: [id number]

## 2011-01-29 NOTE — Discharge Summary (Signed)
Christine Lyons, Christine Lyons                ACCOUNT NO.:  [id number]   MEDICAL RECORD NO.:  [id number]          PATIENT TYPE:  INP   LOCATION:  3019                         FACILITY:  [facility]   PHYSICIAN:  Gaspar Garbe, M.D.DATE OF BIRTH:  1925/03/03   DATE OF ADMISSION:  11/16/2006  DATE OF DISCHARGE:  11/18/2006                               DISCHARGE SUMMARY   DISCHARGE/FINAL DIAGNOSES:  1. Altered mental status secondary to viral syndrome, urinary tract      infection.  2. Viral syndrome, not otherwise specified.  3. Urinary tract infection.  4. Mild hyponatremia.  5. Hypertension.  6. Hyperlipidemia.  7. History of cerebral vascular disease.  8. Headache with nausea and vomiting secondary to above.   MEDICATIONS ON DISCHARGE:  1. Cipro 250 mg p.o. b.i.d. for 5 days.  2. Phenergan 25 mg p.o. q.i.d. p.r.n. for nausea.  3. Tylenol as needed for headache.  4. Plavix 75 mg p.o. daily.  5. Zocor 20 mg p.o. daily.  6. Benicar 40 mg p.o. daily.  7. Norvasc 5 mg p.o. daily.  8. Omega-3 fatty acids daily.  9. Multivitamin one daily.  10.Calcium with Vitamin D 1 tablet twice daily.   PHYSICAL EXAMINATION:  VITAL SIGNS:  On date of discharge, blood  pressure 145/70, heart rate 60, respiratory rate 16, temperature 99.1,  sating 98% on room air.  Sodium 132, potassium 3.8, BUN and creatinine are 9 and 0.56  respectively, glucose 104, white count 5.8, hemoglobin 12.6, hematocrit  36.7, platelets 191.  GENERAL:  In no acute distress.  HEENT:  Normocephalic, atraumatic.  PERRLA.  EOMI.  ENT essentially  within normal limits.  NECK:  Supple, no lymphadenopathy, JVD, or bruits.  HEART:  Regular rate and rhythm, no murmur, rub, or gallop appreciated.  LUNGS:  Clear to auscultation bilaterally.  ABDOMEN:  Soft, nontender, normoactive bowel sounds.  No  hepatosplenomegaly.  EXTREMITIES:  No clubbing, cyanosis, or edema.  NEURO:  The patient is oriented to person, place, and time.  5/5  strength bilaterally.  No lateralizing signs.  1+ deep tendon reflexes.   LABORATORY TESTING:  Vitamin B12 level 567, TSH 0.7, RPR nonreactive.  MRI of the brain shows atrophy, no indication of acute CVA.  CT done  with noncontrast on admission showed no evidence of acute bleed or  stroke.  Telemetry revealed no alteration in her EKG pattern, which was  normal sinus rhythm.  Sed rate 4.   HOSPITAL COURSE:  The patient was admitted late evening on November 16, 2006, secondary to altered mental status.  The patient was having  difficulty with speech and getting words out.  Mid afternoon, she had  developed a headache.  She was able to tolerate her dinner but then  became nauseated and vomited at home.  She was brought by EMS to the  emergency room, where she was initially evaluated by Dr. Ignacia Palma.  CT  findings showed no acute stroke.  Given the patient's prior history of  presumed cerebral vascular disease, for which I do not have  documentation as she is a new patient  for me.  She was admitted and an  MRI was performed showing no acute stroke.  She underwent TSH, B12, and  RPR testing, which was also negative and had improvement by the next  morning.  She had altered mental status as well.  She was given morphine  in the emergency room for her pain, was unable to answers questions  particularly well, but cleared by hospital day #2, and the patient  continued to show improvement.  She still has some nausea and was not  eating particularly well.  Urinalysis showed evidence of a urinary tract  infection, and she was treated with Cipro for this.  She otherwise  improved in strength currently but not fully at her baseline.  I had a  long discussion with the patient's daughter, who opted to take her home,  as she was doing mostly well and will treat her as a viral syndrome and  work on re-feeding her over the next couple of days.  The patient and  her daughter were agreeable with this, and the  patient was discharged in  the morning of November 18, 2006.  We were relieved to find that she had not  had any cerebral vascular events.   INSTRUCTIONS TO PATIENT'S FAMILY:  The patient is to on a BRAT diet and  gradually increase as this the patient feels stronger.  She is to  convalesce over the weekend, and, if she has any further difficulty,  they are to contact the office at [phone].  The patient will be seen in  usual followup.      Gaspar Garbe, M.D.  Electronically Signed     RWT/MEDQ  D:  11/18/2006  T:  11/18/2006  Job:  [id number]

## 2011-01-29 NOTE — Discharge Summary (Signed)
NAMEDOREATHA, OFFER                ACCOUNT NO.:  [id number]   MEDICAL RECORD NO.:  [id number]          PATIENT TYPE:  INP   LOCATION:  1618                         [facility]AC[facility]:  [facility]   PHYSICIAN:  Jackie Plum, M.D.DATE OF BIRTH:  08-04-1925   DATE OF ADMISSION:  10/13/2005  DATE OF DISCHARGE:  10/15/2005                                 DISCHARGE SUMMARY   DIAGNOSES:  1.  Mental status change/confusion, resolved.  Thought to be secondary to a      combination of factors, including metabolic related, hyponatremia, which      is significantly improved, as well as diarrhea but possibly viral      syndrome.  2.  Sinus disease per CT scan.  Outpatient followup recommended.  No      rhinorrhea at this time.  3.  Anemia, stable.  Outpatient followup recommended.  4.  History of hypertension, dyslipidemia, gastroesophageal reflux disease,      transient ischemic attack, status post Nissen fundoplication.  Patient      is to continue her Plavix, Zocor, Norvasc, and Benicar/HCTZ as      previously.  Diagnostic workup of significant revealed patient had an x-      ray of the abdomen and chest which showed no acute intra-abdominal      process.  There was diffuse central airway thickening with patchy      changes, which are concerning for possible pneumonia; however, repeat      two view x-ray of the chest indicated no acute infiltrates; however,      there was cardiomegaly noted, and the patient would benefit from cardiac      followup, including possible echocardiogram, in this regard.  5.  Cardiomegaly per x-ray. Outpatient followup recommended with possible      echocardiogram, if not previously worked up as an outpatient by PCP.   CT scan of the head indicated diffuse atrophy without an intracranial  abnormality acutely.  There was ethmoid and sphenoid sinus disease noted.   WBC 5.7, hemoglobin 11.1, hematocrit 51.8, MCV 96.2, platelet count 182.  Sodium 134, potassium 4.4, chloride  107, CO2 23, glucose 21.  BUN 7,  creatinine 0.7, calcium 8.4, TSH 0.859.  Hemoglobin A1C 5.3.   CONSULTATIONS:  Not applicable.   PROCEDURES:  Not applicable.   CONDITION ON DISCHARGE:  Improved, satisfactory.   Patient was brought to the department on account of confusion.  Please see  H&P dictated by Dr. __________ on October 13, 2005.  She presented with  fever, diarrhea, and confusion.  She had not had any neck stiffness,  photophobia.  There was no rash or any sick contacts.  The patient had a flu  vaccine this year.  She had a temperature of 102.8 degrees Fahrenheit, pulse  96, respiratory rate 18, sat of 97%  by pulse ox on admission.  Neurological  testing indicated somnolence without any focal deficit.  Cardiopulmonary  auscultation was unremarkable.  Abdomen was soft, nontender, nondistended.  Patient was noted to be hyponatremic with a sodium of 127.  Her UA was  negative for any  urinary infection or pyuria.  There is a questionable right  upper lobe infiltrate on x-ray, which indeed on followup, as noted above,  was found to be absent on two view chest x-ray.   On admission, the patient was started on IV antibiotics for presumptive  pneumonia; however, she was not found to have a pneumonia on repeat x-ray  finding on two view.  We also started her on saline infusion, and by the  next day, the patient had improved significantly.  She did not have any  meningismus or any meningeal signs; therefore, would not do any LP.  With  supportive care, patient's overall neurologic status improved.  She was said  to be hydrated on admission, and the cause of patient's mental status change  was thought to be secondary to viral syndrome/gastroenteritis with  dehydration and hyponatremia, which has since resolved.  Patient had some  pain complaints related to frontal headaches, which were treated  supportively with analgesics with significant improvement.   On discharge, the patient  was awake, alert and oriented x3 without any focal  deficits.  Pupils are equal, round and reactive to light.  Extraocular  muscles are intact. Cardiopulmonary auscultation was unremarkable.  Discharge BP was 156/79, pulse 67, respirations 18, temperature 97.4.  Again, temp 97.4 degrees Fahrenheit with O2 saturation of 97% on room air.  She was discharged in stable, satisfactory condition.  Patient is to follow  up with PCP in a couple of weeks routinely.      Jackie Plum, M.D.  Electronically Signed     GO/MEDQ  D:  10/15/2005  T:  10/15/2005  Job:  [id number]

## 2011-01-29 NOTE — H&P (Signed)
Christine Lyons, Christine Lyons                ACCOUNT NO.:  [id number]   MEDICAL RECORD NO.:  [id number]          PATIENT TYPE:  EMS   LOCATION:  MAJO                         FACILITY:  [facility]   PHYSICIAN:  Gaspar Garbe, M.D.DATE OF BIRTH:  May 19, 1925   DATE OF ADMISSION:  11/16/2006  DATE OF DISCHARGE:                              HISTORY & PHYSICAL   CHIEF COMPLAINT:  Altered mental status.   HISTORY OF PRESENT ILLNESS:  The patient is an 77 year old white female  who is new to my practice as of the end of January.  She was seen for a  non-acute visit at that time.  She subsequently had a T11 compression  fracture with recent vertebroplasty.  Patient's family indicates that  today she says that she just did not feel like doing very much but could  not be very specific about this.  She then started to feel bad towards  to the evening time with initiation of a headache prior to supper.  She  was able to make dinner but did not feel like eating very much and then  felt somewhat weak.  Patient's son who then, in attendance around supper  time, indicated that she was unable to answer questions as she normally  would, indicating altered mental status.  They phoned all other  relatives and the decision was made for EMS to come and take her to the  emergency room when it had occurred.  She was assessed by Dr. Ignacia Palma  in the emergency room, and upon finding her having considerable  headaches, was given morphine and Zofran as she had now developed nausea  with some vomiting.  Acute laboratory and CAT scan assessment was  unremarkable, but given that patient was clinically ill, required  admission, I was asked to come see the patient.   Upon seeing the patient, her son and daughter were present in the room.  She was clearly nauseated and having some dry heaves.  Unfortunately,  her altered mental status had worsened some, and even though she was  alert and oriented to person and place as well  as dates and year, was  unable to focus and keep her mind on questions that were asked of her to  further evaluate her altered mental status, indicating the case that  this could have been worse since she was given morphine in the emergency  room.  She indicated that she had a headache, indicated that was not the  worst she had ever had, and that was located behind her left eye towards  her left temple.  She indicated that lights bothered her eyes but not  extremely so.  She denied any other symptoms than except the vomiting  accompanying the headache.  Family indicate that she is not usually a  complainer and they were just not able to able elicit any specific  symptoms out of her either.   ALLERGIES:  CODEINE and HYDROCHLOROTHIAZIDE.   MEDICATIONS:  1. Plavix 75 mg p.o. daily.  2. Zocor 20 mg daily.  3. Benicar 40 mg daily.  4. Norvasc 5 mg daily.  5. Omega 3 fatty acids.  6. Multivitamin once daily.  7. Calcium plus vitamin D twice daily.  8. Fosamax, not been yet initiated by the patient.   PAST MEDICAL HISTORY:  Consistent for:  1. A history of cerebrovascular disease, do not have details on      specifics of this; however, she is on Plavix.  Still awaiting her      full records from her prior physician.  2. Gastroesophageal reflux disease status post Nissen fundoplication.  3. Hypertension.  4. A history of asthma.  5. A history of squamous cell cancer of the lip in 2004.  6. Osteoporosis with recent diagnosis based on T11 fracture status      post vertebroplasty.  7. Hyperlipidemia.   PAST SURGICAL HISTORY:  Is extensive, including:  1. TAH-BSO.  2. Cholecystectomy.  3. Appendectomy.  4. Lip cancer removal in 2004.  5. Recent vertebroplasty in the past couple of weeks.  6. Cataract surgery.  7. Nissen fundoplication in the late 90s.  8. A hernia repair.   SOCIAL HISTORY:  The patient lives with her husband, Gerlene Burdock, in  [place].  Is a nonsmoker, nondrinker.   She has 6 children.   FAMILY HISTORY:  Mother died at age 69 of cerebrovascular disease, also  had a history of hypertension.  Father died at age 30, also with a  history of vascular disease and senile dementia.  Her siblings have  coronary artery disease, dementia as well as a history of emphysema.   REVIEW OF SYSTEMS:  The patient denies any fevers, chills, or sweats.  Notable for headache as noted above.  Denies any sore throat.  Denies  any rhinorrhea or other upper respiratory symptoms.  Denies any  difficulty in moving her neck or pain with moving her neck.  Denies any  pain in her head with walking.  She gets mild photophobia due to  overhead lighting.  Denies any shortness of breath, chest pain, dyspnea  on exertion.  The kids said she generally feels weak.  Denies any  urinary symptoms.  Having nausea and vomiting but notes that her bowel  movements have been normal.  She has not had any recent foreign travel.  Has not taken any new over-the-counter medications.  Musculoskeletal  review is unremarkable.  Neuro:  Patient's family notes that she is a  little bit confused as do I.  The patient is not able to focus enough to  state her indication of what is wrong.  Review of systems otherwise  negative.  Advanced Directive, the patient was listed as FULL CODE.   PHYSICAL EXAM:  VITAL SIGNS:  Temperature 92, pulse 94, respiratory rate  20, blood pressure 136/72, sating 98% on room air.  GENERAL:  In no acute distress.  HEENT:  Normocephalic, atraumatic.  PERRLA.  EOMI.  ENT is within normal  limits.  NECK:  Supple.  No lymphadenopathy, JVD, or bruit.  No nuchal rigidity  is noted.  LUNGS:  Clear to auscultation bilaterally.  HEART:  Regular rate and rhythm.  No murmur, rub, or gallop are  appreciated.  ABDOMEN:  Soft.  Patient states mildly tender just diffuse.  There is no  rebounding or guarding. MUSCULOSKELETAL:  No joint deformities, no weakness, or lateralizing  signs  noted.  NEURO:  The patient is oriented to person and place as well as the date  but not the year.  She is unable to complete mini mental status exam as  she simply  would not answer the questions, could not focus long enough  in order to do so.  Strength is 5/5 bilaterally with 2+ deep tendon  reflexes bilaterally and downgoing toes.   LABS:  White count 8.2, hemoglobin 13.3, hematocrit 38.7, platelets 192.  Sodium 137, potassium 3.9, BUN and creatinine are 16 and 0.7  respectively, glucose slightly elevated at 34.  LFTs are within normal  limits.  She had a urinalysis with a pH of 7, specific gravity of 1.017,  red cells 7-10, white cells 7-10, and leukocyte esterase positive.   RADIOLOGY:  The patient had a non-contrast CT of her head which is not  revealing of any intracranial hemorrhage or any evidence of a stroke as  compared to imaging done greater than a year ago which showed changes  consistent with atrophy but no acuity.  CXR unremarkable.   ASSESSMENT AND PLAN:  1. Altered mental status.  It appears to certainly be more of a      delirium than a dementia complaint.  The only specific sign that      would indicate any causation would be her headache with nausea and      vomiting mostly likely accompanying it.  CT does not show any acute      bleed.  She has been observed by her family, has not been found to      be doing any drinking or any illegal drugs which might alter her      mental status and has not been using over-the-counter medications      surreptitiously per their reports.  We will check an MRI in the      morning to make sure that the CT was not done early enough as to      not detect a cerebrovascular event.  If this is, in fact, the case,      further workup will be necessary looking into cause.  The patient      will remain on telemetry to see if heart rhythm may be related to      her mental status changes.  I have asked that she not receive any      further  morphine as this is clearly hindering our exam on her and      we are unable to distinguish from change in her mental status with      regards narcotics on board versus sure changes within her clinical      situation.  We will use Zofran for nausea as needed.  I will check      a TSH, B12, and RPR.  2. Urinary tract infection.  It does not appear to be a high enough      grade to be causing her level of altered mental status.  Treat this      with Cipro 250 mg b.i.d. x7 days, and follow-up cultures, as they      come back, to adjust therapy as needed.  3. Hypertension.  The patient is currently controlled.  No changes to      her Benicar or Zocor.  4. Hyperlipidemia.  We will consider rechecking a lipid panel;      however, she has had lipids checked by her prior Netta Fodge, I do not      have this currently on hand.  5. Osteoporosis with recent compression fracture.  The patient will     need to begin Fosamax as an outpatient.  We have begun discussions  regarding this.  6. A history of cerebrovascular disease.  Attempt to get more old      records regarding this.  Consider a change from Plavix to Aggrenox      in light of recent data favoring this approach.  7. If the patient does not improve clinically overnight or, in fact,      does worsen, will consider getting a neurologic consult.  8. Nausea and vomiting.  Patient with Zofran and start her back on a      clear liquid diet in the morning.      Gaspar Garbe, M.D.  Electronically Signed     RWT/MEDQ  D:  11/17/2006  T:  11/17/2006  Job:  [id number]

## 2011-03-02 ENCOUNTER — Other Ambulatory Visit: Payer: Self-pay

## 2011-03-02 MED ORDER — AMLODIPINE BESYLATE 5 MG PO TABS: 5.0000 mg | ORAL_TABLET | Freq: Every day | ORAL | Status: DC

## 2011-03-02 NOTE — Telephone Encounter (Signed)
RX sent to pharmacy  

## 2011-03-04 NOTE — Telephone Encounter (Signed)
Note from pharmacy was faxed and indicated patient no longer has coverage with [employer] and rx should be sent to [facility] @ [phone]. RX faxed   I spoke with patient and informed her about pharmacy response and she ok'd rx to be sent to [facility]

## 2011-11-08 ENCOUNTER — Other Ambulatory Visit: Payer: Self-pay | Admitting: Internal Medicine

## 2011-11-30 ENCOUNTER — Encounter: Payer: Self-pay | Admitting: Internal Medicine

## 2011-11-30 ENCOUNTER — Ambulatory Visit (INDEPENDENT_AMBULATORY_CARE_PROVIDER_SITE_OTHER): Payer: Medicare Other | Admitting: Internal Medicine

## 2011-11-30 VITALS — BP 130/86 | HR 64 | Temp 97.9°F | Resp 12 | Ht 59.75 in | Wt 131.0 lb

## 2011-11-30 DIAGNOSIS — M899 Disorder of bone, unspecified: Secondary | ICD-10-CM

## 2011-11-30 DIAGNOSIS — K219 Gastro-esophageal reflux disease without esophagitis: Secondary | ICD-10-CM

## 2011-11-30 DIAGNOSIS — E785 Hyperlipidemia, unspecified: Secondary | ICD-10-CM

## 2011-11-30 DIAGNOSIS — Z0000 Encounter for general adult medical examination without abnormal findings: Secondary | ICD-10-CM

## 2011-11-30 DIAGNOSIS — I10 Essential (primary) hypertension: Secondary | ICD-10-CM

## 2011-11-30 DIAGNOSIS — Z85828 Personal history of other malignant neoplasm of skin: Secondary | ICD-10-CM

## 2011-11-30 LAB — LIPID PANEL
Cholesterol: 112 mg/dL (ref 0–200)
HDL: 58.2 mg/dL (ref >39.00)
Triglycerides: 46 mg/dL (ref 0.0–149.0)
VLDL: 9.2 mg/dL (ref 0.0–40.0)

## 2011-11-30 LAB — CBC WITH DIFFERENTIAL/PLATELET
Eosinophils Absolute: 0.4 10*3/uL (ref 0.0–0.7)
Eosinophils Relative: 5.4 % — ABNORMAL HIGH (ref 0.0–5.0)
HCT: 38.8 % (ref 36.0–46.0)
Hemoglobin: 12.9 g/dL (ref 12.0–15.0)
Lymphs Abs: 1.8 10*3/uL (ref 0.7–4.0)
MCV: 99.7 fl (ref 78.0–100.0)
Neutro Abs: 3.8 10*3/uL (ref 1.4–7.7)
RBC: 3.9 Mil/uL (ref 3.87–5.11)
RDW: 14.3 % (ref 11.5–14.6)
WBC: 6.5 10*3/uL (ref 4.5–10.5)

## 2011-11-30 LAB — BASIC METABOLIC PANEL
BUN: 12 mg/dL (ref 6–23)
Calcium: 9.4 mg/dL (ref 8.4–10.5)
Chloride: 105 mEq/L (ref 96–112)
GFR: 107.1 mL/min (ref >60.00)
Potassium: 3.7 mEq/L (ref 3.5–5.1)
Sodium: 142 mEq/L (ref 135–145)

## 2011-11-30 LAB — HEPATIC FUNCTION PANEL
Albumin: 4.4 g/dL (ref 3.5–5.2)
Alkaline Phosphatase: 42 U/L (ref 39–117)
Bilirubin, Direct: 0.1 mg/dL (ref 0.0–0.3)

## 2011-11-30 NOTE — Patient Instructions (Signed)
Preventive Health Care: Exercise  30-45  minutes a day, 3-4 days a week. Walking is especially valuable in preventing Osteoporosis. Eat a low-fat diet with lots of fruits and vegetables, up to 7-9 servings per day. Consume less than 30 grams of sugar per day from foods & drinks with High Fructose Corn Syrup as # 1,2,3 or #4 on label. Eye Doctor - have an eye exam @ least annually  Blood Pressure Goal  Ideally is an AVERAGE < 135/85. This AVERAGE should be calculated from @ least 5-7 BP readings taken @ different times of day on different days of week. You should not respond to isolated BP readings , but rather the AVERAGE for that week

## 2011-11-30 NOTE — Progress Notes (Signed)
Subjective:    Patient ID: Christine Lyons, female    DOB: Feb 19, 1925, 78 y.o.   MRN: [id number]  HPI Medicare Wellness Visit:  The following psychosocial & medical history were reviewed as required by Medicare.   Social history: caffeine: 2-3 cups/ day & 2 teas, alcohol:  no ,  tobacco use :never  & exercise : 3X/ weeks @"Bends, Bones & Balance":   Home & personal  safety / fall risk:slight imbalance intermittently, activities of daily living: no limitations , seatbelt use : yes , and smoke alarm employment :yes .  Power of Attorney/Living Will status :in place  Vision ( as recorded per Nurse) & Hearing  evaluation : see exam. Orientation :oriented X3 , memory & recall :good, spelling  testing: good,and mood & affect : normal . Depression / anxiety: resolving Travel history : [place]  , immunization status :up to date , transfusion history:  no, and preventive health surveillance ( colonoscopies, BMD , etc as per protocol/ SOC): no colonoscopy F/U needed, Dental care:  Seen every 6 mos . Chart reviewed &  Updated. Active issues reviewed & addressed.       Review of Systems HYPERTENSION: Disease Monitoring: Blood pressure range-not checked  Chest pain, palpitations- no       Dyspnea-no Medications: Compliance-yes Lightheadedness,Syncope- no    Edema- no  PMH of FASTING HYPERGLYCEMIA: Polyuria/phagia/dipsia- no      Visual problems- no Diet: no plan except decreased sweets  HYPERLIPIDEMIA: Disease Monitoring: See symptoms for Hypertension Medications: Compliance-yes  Abd pain, bowel changes- no  Muscle aches- no        Objective:   Physical Exam Gen.: Healthy and well-nourished in appearance. Alert, appropriate and cooperative throughout exam.Appears younger than stated age  Head: Normocephalic without obvious abnormalities Eyes: No corneal or conjunctival inflammation noted.  Extraocular motion intact. Vision grossly normal with lenses. Ears: External  ear exam  reveals no significant lesions or deformities.  Hearing aids bilaterally (from [employer]). Nose: External nasal exam reveals no deformity or inflammation. Nasal mucosa are pink and moist. No lesions or exudates noted. Septum minimally deviated to L Mouth: Oral mucosa and oropharynx reveal no lesions or exudates. Teeth in good repair. Neck: No deformities, masses, or tenderness noted. Range of motion &Thyroid normal Lungs: Normal respiratory effort; chest expands symmetrically. Lungs are clear to auscultation without rales, wheezes, or increased work of breathing. Heart: Normal rate and rhythm. Normal S1 and S2. No gallop, click, or rub. Grade 1/6 systolic murmur  Abdomen: Bowel sounds normal; abdomen soft and nontender. No masses, organomegaly or hernias noted. Genitalia: deferred                                                                  Musculoskeletal/extremities: Mild lordosis noted of  the thoracic  spine. No clubbing, cyanosis, or  edema noted. Range of motion  normal .Tone & strength  Normal.Joints; minimal DIP OA changes. Nail health  good. Vascular: Carotid, radial artery, dorsalis pedis and  posterior tibial pulses are full and equal. No bruits present. Neurologic: Alert and oriented x3. Deep tendon reflexes symmetrical and normal.          Skin: Intact without suspicious lesions or rashes. Lymph: No cervical, axillary lymphadenopathy present. Psych: Mood and  affect are normal. Normally interactive                                                                                         Assessment & Plan:  #1 Medicare Wellness Exam; criteria met ; data entered #2 Problem List reviewed ; Assessment/ Recommendations made Plan: see Orders

## 2011-12-01 LAB — VITAMIN D 25 HYDROXY (VIT D DEFICIENCY, FRACTURES): Vit D, 25-Hydroxy: 55 ng/mL (ref 30–89)

## 2012-02-23 ENCOUNTER — Other Ambulatory Visit: Payer: Self-pay | Admitting: Internal Medicine

## 2012-02-24 NOTE — Telephone Encounter (Signed)
Done

## 2012-08-28 ENCOUNTER — Encounter (HOSPITAL_COMMUNITY): Payer: Self-pay | Admitting: Emergency Medicine

## 2012-08-28 ENCOUNTER — Emergency Department (HOSPITAL_COMMUNITY): Payer: Medicare Other

## 2012-08-28 ENCOUNTER — Inpatient Hospital Stay (HOSPITAL_COMMUNITY)
Admission: EM | Admit: 2012-08-28 | Discharge: 2012-08-31 | DRG: 470 | Disposition: A | Discharge status: Skilled Nursing Facility | Payer: Medicare Other | Attending: Emergency Medicine | Admitting: Emergency Medicine

## 2012-08-28 DIAGNOSIS — D62 Acute posthemorrhagic anemia: Secondary | ICD-10-CM | POA: Diagnosis not present

## 2012-08-28 DIAGNOSIS — W1811XA Fall from or off toilet without subsequent striking against object, initial encounter: Secondary | ICD-10-CM | POA: Diagnosis present

## 2012-08-28 DIAGNOSIS — M810 Age-related osteoporosis without current pathological fracture: Secondary | ICD-10-CM | POA: Diagnosis present

## 2012-08-28 DIAGNOSIS — I10 Essential (primary) hypertension: Secondary | ICD-10-CM | POA: Diagnosis present

## 2012-08-28 DIAGNOSIS — Z8719 Personal history of other diseases of the digestive system: Secondary | ICD-10-CM

## 2012-08-28 DIAGNOSIS — R55 Syncope and collapse: Secondary | ICD-10-CM

## 2012-08-28 DIAGNOSIS — Y92009 Unspecified place in unspecified non-institutional (private) residence as the place of occurrence of the external cause: Secondary | ICD-10-CM

## 2012-08-28 DIAGNOSIS — M949 Disorder of cartilage, unspecified: Secondary | ICD-10-CM

## 2012-08-28 DIAGNOSIS — Z79899 Other long term (current) drug therapy: Secondary | ICD-10-CM

## 2012-08-28 DIAGNOSIS — R001 Bradycardia, unspecified: Secondary | ICD-10-CM | POA: Diagnosis present

## 2012-08-28 DIAGNOSIS — S72009A Fracture of unspecified part of neck of unspecified femur, initial encounter for closed fracture: Secondary | ICD-10-CM | POA: Diagnosis present

## 2012-08-28 DIAGNOSIS — I498 Other specified cardiac arrhythmias: Secondary | ICD-10-CM | POA: Diagnosis present

## 2012-08-28 DIAGNOSIS — M48061 Spinal stenosis, lumbar region without neurogenic claudication: Secondary | ICD-10-CM

## 2012-08-28 DIAGNOSIS — M545 Low back pain, unspecified: Secondary | ICD-10-CM | POA: Diagnosis present

## 2012-08-28 DIAGNOSIS — S72033A Displaced midcervical fracture of unspecified femur, initial encounter for closed fracture: Principal | ICD-10-CM | POA: Diagnosis present

## 2012-08-28 DIAGNOSIS — E785 Hyperlipidemia, unspecified: Secondary | ICD-10-CM | POA: Diagnosis present

## 2012-08-28 DIAGNOSIS — I959 Hypotension, unspecified: Secondary | ICD-10-CM | POA: Diagnosis present

## 2012-08-28 DIAGNOSIS — Z85828 Personal history of other malignant neoplasm of skin: Secondary | ICD-10-CM

## 2012-08-28 DIAGNOSIS — H9190 Unspecified hearing loss, unspecified ear: Secondary | ICD-10-CM

## 2012-08-28 DIAGNOSIS — K219 Gastro-esophageal reflux disease without esophagitis: Secondary | ICD-10-CM | POA: Diagnosis present

## 2012-08-28 DIAGNOSIS — Z8679 Personal history of other diseases of the circulatory system: Secondary | ICD-10-CM

## 2012-08-28 DIAGNOSIS — S72002A Fracture of unspecified part of neck of left femur, initial encounter for closed fracture: Secondary | ICD-10-CM

## 2012-08-28 DIAGNOSIS — G459 Transient cerebral ischemic attack, unspecified: Secondary | ICD-10-CM | POA: Clinically undetermined

## 2012-08-28 DIAGNOSIS — Z8673 Personal history of transient ischemic attack (TIA), and cerebral infarction without residual deficits: Secondary | ICD-10-CM

## 2012-08-28 LAB — BASIC METABOLIC PANEL
BUN: 14 mg/dL (ref 6–23)
CO2: 22 mEq/L (ref 19–32)
Calcium: 9.5 mg/dL (ref 8.4–10.5)
Chloride: 98 mEq/L (ref 96–112)
Creatinine, Ser: 0.58 mg/dL (ref 0.50–1.10)

## 2012-08-28 LAB — URINALYSIS, ROUTINE W REFLEX MICROSCOPIC
Bilirubin Urine: NEGATIVE
Glucose, UA: NEGATIVE mg/dL
Hgb urine dipstick: NEGATIVE
Specific Gravity, Urine: 1.024 (ref 1.005–1.030)
pH: 6 (ref 5.0–8.0)

## 2012-08-28 LAB — CBC
HCT: 37.6 % (ref 36.0–46.0)
MCH: 32.6 pg (ref 26.0–34.0)
MCV: 96.4 fL (ref 78.0–100.0)
RBC: 3.9 MIL/uL (ref 3.87–5.11)
WBC: 8.9 10*3/uL (ref 4.0–10.5)

## 2012-08-28 MED ORDER — ASPIRIN EC 81 MG PO TBEC: 81.0000 mg | DELAYED_RELEASE_TABLET | Freq: Every day | ORAL | Status: DC

## 2012-08-28 MED ORDER — MORPHINE SULFATE 2 MG/ML IJ SOLN: 0.5000 mg | INTRAMUSCULAR | Status: DC | PRN

## 2012-08-28 MED ORDER — HYDROCODONE-ACETAMINOPHEN 5-325 MG PO TABS
1.0000 | ORAL_TABLET | ORAL | Status: DC | PRN
  Administered 2012-08-29: 1 via ORAL
  Administered 2012-08-30 – 2012-08-31 (×3): 2 via ORAL
  Filled 2012-08-28 (×4): qty 2
  Filled 2012-08-28: qty 1

## 2012-08-28 MED ORDER — SODIUM CHLORIDE 0.9 % IJ SOLN
3.0000 mL | Freq: Two times a day (BID) | INTRAMUSCULAR | Status: DC
  Administered 2012-08-28 – 2012-08-30 (×3): 3 mL via INTRAVENOUS

## 2012-08-28 MED ORDER — SIMVASTATIN 20 MG PO TABS
20.0000 mg | ORAL_TABLET | Freq: Every day | ORAL | Status: DC
  Administered 2012-08-29 – 2012-08-30 (×2): 20 mg via ORAL
  Filled 2012-08-28 (×3): qty 1

## 2012-08-28 MED ORDER — CALCIUM CITRATE 950 (200 CA) MG PO TABS
1.0000 | ORAL_TABLET | Freq: Every day | ORAL | Status: DC
  Administered 2012-08-28 – 2012-08-31 (×4): 200 mg via ORAL
  Filled 2012-08-28 (×4): qty 1

## 2012-08-28 MED ORDER — CLOPIDOGREL BISULFATE 75 MG PO TABS
75.0000 mg | ORAL_TABLET | Freq: Every day | ORAL | Status: DC
  Filled 2012-08-28 (×2): qty 1

## 2012-08-28 MED ORDER — IRBESARTAN 75 MG PO TABS
75.0000 mg | ORAL_TABLET | Freq: Every day | ORAL | Status: DC
  Administered 2012-08-28 – 2012-08-29 (×2): 75 mg via ORAL
  Filled 2012-08-28 (×3): qty 1

## 2012-08-28 MED ORDER — MORPHINE SULFATE 4 MG/ML IJ SOLN
4.0000 mg | Freq: Once | INTRAMUSCULAR | Status: AC
  Administered 2012-08-28: 4 mg via INTRAVENOUS

## 2012-08-28 MED ORDER — MORPHINE SULFATE 4 MG/ML IJ SOLN
4.0000 mg | Freq: Once | INTRAMUSCULAR | Status: AC
  Administered 2012-08-28: 4 mg via INTRAVENOUS
  Filled 2012-08-28: qty 1

## 2012-08-28 MED ORDER — HYDROCODONE-ACETAMINOPHEN 5-325 MG PO TABS: 1.0000 | ORAL_TABLET | Freq: Four times a day (QID) | ORAL | Status: DC | PRN

## 2012-08-28 MED ORDER — ENOXAPARIN SODIUM 30 MG/0.3ML ~~LOC~~ SOLN
30.0000 mg | SUBCUTANEOUS | Status: DC
  Filled 2012-08-28 (×2): qty 0.3

## 2012-08-28 MED ORDER — MORPHINE SULFATE 2 MG/ML IJ SOLN
0.5000 mg | INTRAMUSCULAR | Status: DC | PRN
  Administered 2012-08-29: 0.5 mg via INTRAVENOUS
  Filled 2012-08-28: qty 1

## 2012-08-28 MED ORDER — MORPHINE SULFATE 4 MG/ML IJ SOLN
6.0000 mg | Freq: Once | INTRAMUSCULAR | Status: AC
  Administered 2012-08-28: 6 mg via INTRAVENOUS
  Filled 2012-08-28: qty 2

## 2012-08-28 MED ORDER — ENOXAPARIN SODIUM 30 MG/0.3ML ~~LOC~~ SOLN: 30.0000 mg | Freq: Two times a day (BID) | SUBCUTANEOUS | Status: DC

## 2012-08-28 MED ORDER — AMLODIPINE BESYLATE 5 MG PO TABS
5.0000 mg | ORAL_TABLET | Freq: Every day | ORAL | Status: DC
  Administered 2012-08-28 – 2012-08-29 (×2): 5 mg via ORAL
  Filled 2012-08-28 (×3): qty 1

## 2012-08-28 MED ORDER — MORPHINE SULFATE 4 MG/ML IJ SOLN
INTRAMUSCULAR | Status: AC
  Administered 2012-08-28: 4 mg via INTRAVENOUS
  Filled 2012-08-28: qty 1

## 2012-08-28 NOTE — ED Notes (Signed)
XR in room 

## 2012-08-28 NOTE — ED Provider Notes (Signed)
History     CSN: [id number]  Arrival date & time 08/28/12  1125   First MD Initiated Contact with Patient 08/28/12 1128      Chief Complaint  Patient presents with  . Fall    left hip     The history is provided by the patient.   the patient reports falling after becoming dizzy on her way to the bathroom.  She fell off the toilet and landed on her left hip.  She reports pain in her left hip at this time.  She denies neck pain.  She did not injure her head.  She is not on anticoagulants.  She has no cardiac or pulmonary problems.  Her pain is mild to moderate in severity.  Her pain is worsened with movement.  Nothing improves her pain.  She denies chest pain shortness of breath or abdominal pain.  Family reports normal level of consciousness.  This x-ray occurred last night and she was able to hobble around the facility thinking that her pain would get better.  Past Medical History  Diagnosis Date  . Hyperlipidemia   . Hypertension   . Stroke 1999    TIA in [facility]    Past Surgical History  Procedure Date  . Appendectomy   . Cholecystectomy   . Abdominal hysterectomy     with BSO for dysfunctional menses  . Tonsillectomy and adenoidectomy   . Laparoscopic gastric banding     to prevent GERD  . Colonscopy     diverticulosis  . G 7 p 6     Family History  Problem Relation Age of Onset  . Hypertension Mother   . Heart disease Brother     after 59    History  Substance Use Topics  . Smoking status: Never Smoker   . Smokeless tobacco: Not on file  . Alcohol Use: No    OB History    Grav Para Term Preterm Abortions TAB SAB Ect Mult Living                  Review of Systems  All other systems reviewed and are negative.    Allergies  Codeine  Home Medications   Current Outpatient Rx  Name  Route  Sig  Dispense  Refill  . ALENDRONATE SODIUM PO   Oral   Take by mouth.         . AMLODIPINE BESYLATE 5 MG PO TABS   Oral   Take 5 mg by mouth daily.          Marland Kitchen CALCIUM CITRATE 950 MG PO TABS   Oral   Take 1 tablet by mouth daily.         Marland Kitchen CLOPIDOGREL BISULFATE 75 MG PO TABS   Oral   Take 75 mg by mouth daily.         . ADULT MULTIVITAMIN W/MINERALS CH   Oral   Take 1 tablet by mouth daily.         . ICAPS PO   Oral   Take by mouth.         . BENICAR PO   Oral   Take by mouth.         Marland Kitchen SIMVASTATIN 20 MG PO TABS   Oral   Take 20 mg by mouth every evening.         Marland Kitchen AMLODIPINE BESYLATE 5 MG PO TABS      TAKE 1 TABLET DAILY   90 tablet  1   . VITAMIN D3 1000 UNITS PO CAPS   Oral   Take by mouth daily.         Marland Kitchen CLOPIDOGREL BISULFATE 75 MG PO TABS      TAKE 1 TABLET DAILY   90 tablet   1   . LOSARTAN POTASSIUM 100 MG PO TABS   Oral   Take 1 tablet (100 mg total) by mouth daily.   90 tablet   2   . LOSARTAN POTASSIUM 100 MG PO TABS      TAKE 1 TABLET DAILY   90 tablet   1   . HAIR/SKIN/NAILS PO   Oral   Take by mouth daily.         . OCUVITE-LUTEIN PO CAPS   Oral   Take 1 capsule by mouth daily.         Marland Kitchen SIMVASTATIN 20 MG PO TABS   Oral   Take 1 tablet (20 mg total) by mouth every evening.   90 tablet   2   . SIMVASTATIN 20 MG PO TABS      TAKE 1 TABLET EVERY EVENING   90 tablet   1   . VITAMIN E 400 UNITS PO CAPS   Oral   Take 400 Units by mouth daily.           BP 126/50  Pulse 64  Temp 98.3 F (36.8 C) (Oral)  Resp 18  SpO2 93%  Physical Exam  Nursing note and vitals reviewed. Constitutional: She is oriented to person, place, and time. She appears well-developed and well-nourished. No distress.  HENT:  Head: Normocephalic and atraumatic.  Eyes: EOM are normal.  Neck: Normal range of motion. Neck supple.       C-spine nontender.  C-spine cleared by Nexus criteria  Cardiovascular: Normal rate, regular rhythm and normal heart sounds.   Pulmonary/Chest: Effort normal and breath sounds normal.  Abdominal: Soft. She exhibits no distension. There is no  tenderness.  Musculoskeletal: Normal range of motion.       Full range of motion of right ankle right knee and right hip.  Pain with range of motion of the left hip.  No external rotation or shortening noted of her left lower extremity.  Normal pulses in her left foot.  Compartments are soft.  Neurological: She is alert and oriented to person, place, and time.  Skin: Skin is warm and dry.  Psychiatric: She has a normal mood and affect. Judgment normal.    ED Course  Procedures (including critical care time)   Date: 08/28/2012  Rate: 72  Rhythm: normal sinus rhythm  QRS Axis: normal  Intervals: normal  ST/T Wave abnormalities: normal  Conduction Disutrbances: none  Narrative Interpretation:   Old EKG Reviewed: No significant changes noted  Labs Reviewed  BASIC METABOLIC PANEL - Abnormal; Notable for the following:    Glucose, Bld 117 (*)     GFR calc non Af Amer 82 (*)     All other components within normal limits  URINALYSIS, ROUTINE W REFLEX MICROSCOPIC - Abnormal; Notable for the following:    Ketones, ur 40 (*)     All other components within normal limits  CBC  PROTIME-INR  TROPONIN I  URINE CULTURE  TSH   Dg Chest 1 View  08/28/2012  *RADIOLOGY REPORT*  Clinical Data: Left hip pain status post fall today.  CHEST - 1 VIEW  Comparison: 11/16/2006 portable examination.  Findings: 1334 hours.  Positioning is lordotic.  Allowing for this, the heart size and mediastinal contours are stable.  Right perihilar fullness may be secondary to positioning.  The lungs are otherwise clear.  There is no pleural effusion.  Multiple surgical clips are present within the left upper quadrant of the abdomen. Patient is status post lower thoracic spinal augmentation.  There is a possible loose body within the left superior subscapularis recess.  IMPRESSION:  1.  No definite acute cardiopulmonary process. 2.  Altered appearance of the right hilum may be secondary to positioning.  Radiographic  follow-up with attention to this area is recommended to exclude a developing nodule or infiltrate.   Original Report Authenticated By: Carey Bullocks, M.D.    Dg Hip Complete Left  08/28/2012  *RADIOLOGY REPORT*  Clinical Data: Fall  LEFT HIP - COMPLETE 2+ VIEW  Comparison: 10/13/2005  Findings: Mild osteopenia.  No acute fracture and no dislocation. Mild degenerative change of the SI and hip joints.  IMPRESSION: No acute bony pathology.   Original Report Authenticated By: Jolaine Click, M.D.    Ct Hip Left Wo Contrast  08/28/2012  *RADIOLOGY REPORT*  Clinical Data: Status post fall.  Left hip pain.  Question fracture.  CT OF THE LEFT HIP WITHOUT CONTRAST  Technique:  Multidetector CT imaging was performed according to the standard protocol. Multiplanar CT image reconstructions were also generated.  Comparison: Plain films earlier this same date.  Findings: The patient has a mildly impacted subcapital fracture of the left hip.  There is an associated small hip joint effusion.  No other fracture is identified.  No dislocation.  There is no acute or focal abnormality of the visualized intrapelvic contents.  IMPRESSION: Mildly impacted subcapital fracture left hip.   Original Report Authenticated By: [name], M.D.    I personally reviewed the imaging tests through PACS system I reviewed available ER/hospitalization records through the EMR    1. Closed left hip fracture   2. Syncope       MDM  The patient has evidence of a left-sided hip fracture on CT scan.  Pain treated.  N.p.o.  The patient be admitted to hospitalist service.  Awaiting callback from orthopedics at this time.        Lyanne Co, MD 08/28/12 [phone]

## 2012-08-28 NOTE — Progress Notes (Addendum)
Patient admitted to 5E from ED, alert and oriented, transferred by hospital bed, tolerated well, daughter at bedside, Dr notified of patient's arrival to floor, BP=>158/79,P=>79,temp=>99.2, Sats=>91% on RA, foley cath draining clear yellow urine, pain level on scale of 1-10 rated at 3, pupils reactive to light pen, skin dry and intact, patient in stable condition at this tim

## 2012-08-28 NOTE — Progress Notes (Signed)
Clinical Social Work Department BRIEF PSYCHOSOCIAL ASSESSMENT 08/28/2012  Patient:  Christine Lyons, Christine Lyons     Account Number:  [id number]     Admit date:  08/28/2012  Clinical Social Worker:  Doree Albee  Date/Time:  08/28/2012 04:30 PM  Referred by:  RN  Date Referred:  08/28/2012 Referred for  SNF Placement   Other Referral:   Interview type:  Patient Other interview type:   patient daughter    PSYCHOSOCIAL DATA Living Status:  FACILITY Admitted from facility:  [facility] Level of care:  Independent Living Primary support name:  Claris Pong Primary support relationship to patient:  CHILD, ADULT Degree of support available:   strong    CURRENT CONCERNS Current Concerns  Post-Acute Placement   Other Concerns:    SOCIAL WORK ASSESSMENT / PLAN CSW met with pt and pt daugher at bedside to complete psychosocial assessment. Pt shared she is an indepdent resident at [facility] and would like to return when medically stable. Pt states she is open to going to skilled nursing for short term rehab when discharged but is motivated to return to apartment as soon as possible.    CSW spoke with [facility] regarding patient disposition needs. Patient may have grace days if needed.    CSW will complete fl2 and place in pt shadow chart for MD signature. Please see placement note for progress of palcement.   Assessment/plan status:  Psychosocial Support/Ongoing Assessment of Needs Other assessment/ plan:   and discharge planning   Information/referral to community resources:   none identified at this time    PATIENT'S/FAMILY'S RESPONSE TO PLAN OF CARE: Pt and pt daughter thanked csw for concern and support. Pt is motivated to reutrn to [facility] for rehab and then to her apartment.    Catha Gosselin, LCSWA  [phone] .08/28/2012 1713pm

## 2012-08-28 NOTE — Consult Note (Signed)
CC: left hip pain s/p fall HPI: 78 y/o female with ground level fall at her assisted living facility last night at 0130. Pt c/o moderate to severe pain with movement of the left hip. Seen in emergency room and diagnosed with minimally displaced femur fracture. Denies pain unless leg is moved. Denies any other injury or prior issues with the left hip PMH: hypertension, hyperlipidemia, stroke Surgical: appendectomy, cholecystecomy, hysterectomy, T and A Social: non smoker, no alcohol, no illicit drugs Family: hypertension, coronary heart disease Allergies: codeine Meds: amlodipine, calcium, plavix, benicar, zocor, cozaar ROS: pain with weight bearing and movement of left lower extremity, lives in assisted living Mild hard of hearing PE: alert and appropriate 78 y/o female in no acute distress with stable vital signs Cranial nerves 2-12 intact with good rom of cervical spine Bilateral upper extremities show no injuries, nv intact distally Right lower extremity: full rom non tender, mild edema distally Left lower leg: moderate pain with log roll, mild edema distally, no rashes Pain and inability to weight bear Abd. Non tender non distended X-rays: Ct scan shows minimally displaced femoral neck fracture Assessment: left femoral neck fracture Plan: plan for surgery tomorrow afternoon Pt and family in agreement NPO after breakfast Pain control Non weight bearing left lower extremity

## 2012-08-28 NOTE — ED Notes (Signed)
Off floor for testing 

## 2012-08-28 NOTE — ED Notes (Signed)
Pt states he pain is controlled at the moment.

## 2012-08-28 NOTE — ED Notes (Signed)
Hold foley placement per Dr Patria Mane

## 2012-08-28 NOTE — ED Notes (Signed)
Transport called-ice applied to affected area

## 2012-08-28 NOTE — Progress Notes (Signed)
Clinical Social Work Department CLINICAL SOCIAL WORK PLACEMENT NOTE 08/28/2012  Patient:  CAMBRIE, SONNENFELD  Account Number:  [id number] Admit date:  08/28/2012  Clinical Social Worker:  Doree Albee  Date/time:  08/28/2012 05:16 PM  Clinical Social Work is seeking post-discharge placement for this patient at the following level of care:   SKILLED NURSING   (*CSW will update this form in Epic as items are completed)   08/28/2012  Patient/family provided with [facility] Department of Clinical Social Work's list of facilities offering this level of care within the geographic area requested by the patient (or if unable, by the patient's family).  08/28/2012  Patient/family informed of their freedom to choose among providers that offer the needed level of care, that participate in Medicare, Medicaid or managed care program needed by the patient, have an available bed and are willing to accept the patient.  08/28/2012  Patient/family informed of [facility]' ownership interest in [facility], as well as of the fact that they are under no obligation to receive care at this facility.  PASARR submitted to EDS on 08/28/2012 PASARR number received from EDS on 08/28/2012  FL2 transmitted to all facilities in geographic area requested by pt/family on  08/28/2012 FL2 transmitted to all facilities within larger geographic area on   Patient informed that his/her managed care company has contracts with or will negotiate with  certain facilities, including the following:     Patient/family informed of bed offers received:   Patient chooses bed at  Physician recommends and patient chooses bed at    Patient to be transferred to  on   Patient to be transferred to facility by   The following physician request were entered in Epic:   Additional Comments:

## 2012-08-28 NOTE — Progress Notes (Signed)
Orthopedics Progress Note  Subjective: My hip hurts if I move at all.  Objective:  Filed Vitals:   08/28/12 2245  BP: 133/67  Pulse: 76  Temp: 98.8 F (37.1 C)  Resp: 20    General: Awake and alert  Musculoskeletal: Left hip pain with any ROM, wiggles toes Right LE no pain with AROM, Bilateral UEs 5/5 motor with no pain Neurovascularly intact  Lab Results  Component Value Date   WBC 8.9 08/28/2012   HGB 12.7 08/28/2012   HCT 37.6 08/28/2012   MCV 96.4 08/28/2012   PLT 234 08/28/2012       Component Value Date/Time   NA 136 08/28/2012 1205   K 4.2 08/28/2012 1205   CL 98 08/28/2012 1205   CO2 22 08/28/2012 1205   GLUCOSE 117* 08/28/2012 1205   BUN 14 08/28/2012 1205   CREATININE 0.58 08/28/2012 1205   CALCIUM 9.5 08/28/2012 1205   GFRNONAA 82* 08/28/2012 1205   GFRAA >90 08/28/2012 1205    Lab Results  Component Value Date   INR 1.02 08/28/2012    Assessment/Plan: HD#1 s/p fall with minimally displace femoral neck fracture Discussed treatment options with the patient and her daughter who is at the bedside and recommended surgical management for pain control and to allow mobilization.  We discussed the relative merits and risks of percutaneous cannulated screws versus hemiarthroplasty.  Given the patients age and the difficulty in maintaining the non-weight bearing status, I feel that the hemiarthroplasty would be the more reliable option.  I have communicated with Dr Durene Romans, a hip specialist in my group, who has dedicated OR time tomorrow and he has agreed to perform the surgery.  The patient and her daughter are in agreement with that plan. Patient has consent and NPO orders in place in EPIC.  Patient has not had Plavix in 2 days.  Almedia Balls. Ranell Patrick, MD 08/28/2012 11:29 PM

## 2012-08-28 NOTE — ED Notes (Signed)
ZOX:[id number]<AV> Expected date:<BR> Expected time:<BR> Means of arrival:<BR> Comments:<BR> Fall/hip pain

## 2012-08-28 NOTE — H&P (Signed)
[facility] History and Physical  Christine Lyons ZOX:[id number] DOB: Jun 19, 1925 DOA: 08/28/2012  Referring physician: ED physician PCP: [name], MD   Chief Complaint: Hip fracture  HPI:  This 78 year old female with history of hypertension who presents to [facility] long emergency department after an episode of fall at home. She reports she was trying to get to the bathroom, she fell off the toilet and landed on her left hip. She reports pain started several minutes after the fall, she describes it as sharp and intermittent in nature, 7/10 in severity when present, worse with movement and improved with rest. Patient denies any prodromal symptoms except dizziness but no headaches, no visual changes, no chest pain or shortness of breath, no palpitations. Patient denies similar events in the past.  ED EVENTS: Mildly impacted subcapital fracture left hip noted on x-ray.  Assessment and Plan:  Principal Problem:  *Hip fracture - This is likely secondary to mechanical fall, will admit patient to telemetry for further evaluation and management - Ortho was consulted in emergency department, we'll continue to follow up on their recommendations - will keep patient n.p.o. for now, provide supportive care with analgesia as needed - Patient will eventually need physical therapy evaluation for further recommendations on appropriate discharge Active Problems:  HYPERTENSION - Continue home medication regimen  Code Status: Full Family Communication: Pt and daughter at bedside Disposition Plan: Admit to telemetry    Review of Systems:  Constitutional: Negative for fever, chills and malaise/fatigue. Negative for diaphoresis.  HENT: Negative for hearing loss, ear pain, nosebleeds, congestion, sore throat, neck pain, tinnitus and ear discharge.   Eyes: Negative for blurred vision, double vision, photophobia, pain, discharge and redness.  Respiratory: Negative for cough, hemoptysis, sputum  production, shortness of breath, wheezing and stridor.   Cardiovascular: Negative for chest pain, palpitations, orthopnea, claudication and leg swelling.  Gastrointestinal: Negative for nausea, vomiting and abdominal pain. Negative for heartburn, constipation, blood in stool and melena.  Genitourinary: Negative for dysuria, urgency, frequency, hematuria and flank pain.  Musculoskeletal: Negative for myalgias, positive for back pain, hip pain and falls.  Skin: Negative for itching and rash.  Neurological: Positive for dizziness and weakness. Negative for tingling, tremors, sensory change, speech change, focal weakness, loss of consciousness and headaches.  Endo/Heme/Allergies: Negative for environmental allergies and polydipsia. Does not bruise/bleed easily.  Psychiatric/Behavioral: Negative for suicidal ideas. The patient is not nervous/anxious.      Past Medical History  Diagnosis Date  . Hyperlipidemia   . Hypertension   . Stroke 1999    TIA in [facility]    Past Surgical History  Procedure Date  . Appendectomy   . Cholecystectomy   . Abdominal hysterectomy     with BSO for dysfunctional menses  . Tonsillectomy and adenoidectomy   . Laparoscopic gastric banding     to prevent GERD  . Colonscopy     diverticulosis  . G 7 p 6     Social History:  reports that she has never smoked. She has never used smokeless tobacco. She reports that she does not drink alcohol or use illicit drugs.  Allergies  Allergen Reactions  . Codeine     nausea    Family History  Problem Relation Age of Onset  . Hypertension Mother   . Heart disease Brother     after 55  . Dementia Father    Medication Sig  amLODipine (NORVASC) 5 MG tablet Take 5 mg by mouth daily.  calcium citrate  950  MG tablet Take 1 tablet by mouth daily.  clopidogrel (PLAVIX) 75 MG tablet Take 75 mg by mouth daily.  Multiple Vitamins-Minerals (ICAPS PO) Take by mouth.  olmesartan (BENICAR) 5 MG tablet Take 5 mg by mouth  daily.  simvastatin (ZOCOR) 20 MG tablet Take 20 mg by mouth every evening.  losartan (COZAAR) 100 MG tablet Take 1 tablet (100 mg total) by mouth daily.  simvastatin (ZOCOR) 20 MG tablet Take 1 tablet (20 mg total) by mouth every evening.   Physical Exam: Filed Vitals:   08/28/12 1140 08/28/12 1549  BP: 126/50 151/60  Pulse: 64 76  Temp: 98.3 F (36.8 C) 98.6 F (37 C)  TempSrc: Oral Oral  Resp: 18 16  SpO2: 93% 94%   Physical Exam  Constitutional: Appears well-developed and well-nourished. No distress.  HENT: Normocephalic. External right and left ear normal. Oropharynx is clear and moist.  Eyes: Conjunctivae and EOM are normal. PERRLA, no scleral icterus.  Neck: Normal ROM. Neck supple. No JVD. No tracheal deviation. No thyromegaly.  CVS: RRR, S1/S2 +, no murmurs, no gallops, no carotid bruit.  Pulmonary: Effort and breath sounds normal, no stridor, rhonchi, wheezes, rales.  Abdominal: Soft. BS +,  no distension, tenderness, rebound or guarding.  Musculoskeletal: tenderness to palpation in the left hip area. Lymphadenopathy: No lymphadenopathy noted, cervical, inguinal. Neuro: Alert. Normal reflexes, muscle tone coordination. No cranial nerve deficit. Skin: Skin is warm and dry. No rash noted. Not diaphoretic. No erythema. No pallor.  Psychiatric: Normal mood and affect. Behavior, judgment, thought content normal.   Labs on Admission:  Basic Metabolic Panel:  Lab 08/28/12 4782  NA 136  K 4.2  CL 98  CO2 22  GLUCOSE 117*  BUN 14  CREATININE 0.58  CALCIUM 9.5  MG --  PHOS --   CBC:  Lab 08/28/12 1205  WBC 8.9  NEUTROABS --  HGB 12.7  HCT 37.6  MCV 96.4  PLT 234   Cardiac Enzymes:  Lab 08/28/12 1205  CKTOTAL --  CKMB --  CKMBINDEX --  TROPONINI <0.30    Radiological Exams on Admission: Dg Chest 1 View 08/28/2012   1.  No definite acute cardiopulmonary process.  2.  Altered appearance of the right hilum may be secondary to positioning.  Radiographic  follow-up with attention to this area is recommended to exclude a developing nodule or infiltrate.    Dg Hip Complete Left 08/28/2012   No acute bony pathology.     Ct Hip Left Wo Contrast 08/28/2012  Mildly impacted subcapital fracture left hip.    EKG: Normal sinus rhythm, no ST/T wave changes  Debbora Presto, MD  [facility] Pager [phone]  If 7PM-7AM, please contact night-coverage [email or web address] Password [email or web address] 08/28/2012, 4:30 PM

## 2012-08-28 NOTE — ED Notes (Signed)
Pt states that she cannot put a lot of pressure of left leg. No external rotation or shortness of left leg.

## 2012-08-28 NOTE — ED Notes (Signed)
Patient transported to CT 

## 2012-08-28 NOTE — ED Notes (Addendum)
Per EMS: Pt went to bathroom and became dizzy when getting off toilet and fell on left hip, limited movement. Right foot is numb. 200 of fentanyl given by EMS

## 2012-08-29 ENCOUNTER — Encounter (HOSPITAL_COMMUNITY)
Admission: EM | Disposition: A | Discharge status: Skilled Nursing Facility | Payer: Self-pay | Source: Home / Self Care | Attending: Internal Medicine

## 2012-08-29 ENCOUNTER — Inpatient Hospital Stay (HOSPITAL_COMMUNITY): Payer: Medicare Other | Admitting: Anesthesiology

## 2012-08-29 ENCOUNTER — Encounter (HOSPITAL_COMMUNITY): Payer: Self-pay | Admitting: Physician Assistant

## 2012-08-29 ENCOUNTER — Encounter (HOSPITAL_COMMUNITY): Payer: Self-pay | Admitting: Anesthesiology

## 2012-08-29 ENCOUNTER — Inpatient Hospital Stay (HOSPITAL_COMMUNITY): Payer: Medicare Other

## 2012-08-29 DIAGNOSIS — R001 Bradycardia, unspecified: Secondary | ICD-10-CM | POA: Diagnosis present

## 2012-08-29 DIAGNOSIS — I10 Essential (primary) hypertension: Secondary | ICD-10-CM

## 2012-08-29 DIAGNOSIS — E785 Hyperlipidemia, unspecified: Secondary | ICD-10-CM

## 2012-08-29 DIAGNOSIS — I498 Other specified cardiac arrhythmias: Secondary | ICD-10-CM

## 2012-08-29 DIAGNOSIS — Z01810 Encounter for preprocedural cardiovascular examination: Secondary | ICD-10-CM

## 2012-08-29 HISTORY — PX: HIP ARTHROPLASTY: SHX981

## 2012-08-29 LAB — SURGICAL PCR SCREEN
MRSA, PCR: NEGATIVE
Staphylococcus aureus: NEGATIVE

## 2012-08-29 LAB — GLUCOSE, CAPILLARY
Glucose-Capillary: 112 mg/dL — ABNORMAL HIGH (ref 70–99)
Glucose-Capillary: 112 mg/dL — ABNORMAL HIGH (ref 70–99)
Glucose-Capillary: 118 mg/dL — ABNORMAL HIGH (ref 70–99)
Glucose-Capillary: 129 mg/dL — ABNORMAL HIGH (ref 70–99)

## 2012-08-29 LAB — CBC
HCT: 35.5 % — ABNORMAL LOW (ref 36.0–46.0)
Hemoglobin: 11.8 g/dL — ABNORMAL LOW (ref 12.0–15.0)
MCH: 32.8 pg (ref 26.0–34.0)
MCHC: 33.2 g/dL (ref 30.0–36.0)
MCV: 98.6 fL (ref 78.0–100.0)
RDW: 14.2 % (ref 11.5–15.5)

## 2012-08-29 LAB — BASIC METABOLIC PANEL
BUN: 9 mg/dL (ref 6–23)
CO2: 23 mEq/L (ref 19–32)
Chloride: 99 mEq/L (ref 96–112)
Creatinine, Ser: 0.43 mg/dL — ABNORMAL LOW (ref 0.50–1.10)
Glucose, Bld: 104 mg/dL — ABNORMAL HIGH (ref 70–99)

## 2012-08-29 SURGERY — HEMIARTHROPLASTY, HIP, DIRECT ANTERIOR APPROACH, FOR FRACTURE
Anesthesia: General | Site: Hip | Laterality: Left | Wound class: Clean

## 2012-08-29 MED ORDER — ONDANSETRON HCL 4 MG/2ML IJ SOLN
INTRAMUSCULAR | Status: DC | PRN
  Administered 2012-08-29: 2 mg via INTRAVENOUS

## 2012-08-29 MED ORDER — LIDOCAINE HCL (CARDIAC) 20 MG/ML IV SOLN
INTRAVENOUS | Status: DC | PRN
  Administered 2012-08-29: 50 mg via INTRAVENOUS

## 2012-08-29 MED ORDER — HYDRALAZINE HCL 20 MG/ML IJ SOLN
10.0000 mg | Freq: Four times a day (QID) | INTRAMUSCULAR | Status: DC | PRN
  Administered 2012-08-29: 10 mg via INTRAVENOUS
  Filled 2012-08-29: qty 1

## 2012-08-29 MED ORDER — CEFAZOLIN SODIUM-DEXTROSE 2-3 GM-% IV SOLR
2.0000 g | INTRAVENOUS | Status: AC
  Administered 2012-08-29: 2 g via INTRAVENOUS

## 2012-08-29 MED ORDER — SUCCINYLCHOLINE CHLORIDE 20 MG/ML IJ SOLN
INTRAMUSCULAR | Status: DC | PRN
  Administered 2012-08-29: 100 mg via INTRAVENOUS

## 2012-08-29 MED ORDER — FENTANYL CITRATE 0.05 MG/ML IJ SOLN
INTRAMUSCULAR | Status: DC | PRN
  Administered 2012-08-29: 50 ug via INTRAVENOUS
  Administered 2012-08-29 (×2): 25 ug via INTRAVENOUS
  Administered 2012-08-29: 50 ug via INTRAVENOUS
  Administered 2012-08-29: 25 ug via INTRAVENOUS

## 2012-08-29 MED ORDER — ONDANSETRON HCL 4 MG/2ML IJ SOLN
4.0000 mg | Freq: Four times a day (QID) | INTRAMUSCULAR | Status: DC | PRN
  Administered 2012-08-29 – 2012-08-31 (×2): 4 mg via INTRAVENOUS
  Filled 2012-08-29 (×2): qty 2

## 2012-08-29 MED ORDER — NEOSTIGMINE METHYLSULFATE 1 MG/ML IJ SOLN
INTRAMUSCULAR | Status: DC | PRN
  Administered 2012-08-29: 3 mg via INTRAVENOUS

## 2012-08-29 MED ORDER — CHLORHEXIDINE GLUCONATE 4 % EX LIQD
60.0000 mL | Freq: Once | CUTANEOUS | Status: DC
  Filled 2012-08-29: qty 60

## 2012-08-29 MED ORDER — CISATRACURIUM BESYLATE (PF) 10 MG/5ML IV SOLN
INTRAVENOUS | Status: DC | PRN
  Administered 2012-08-29: 5 mg via INTRAVENOUS

## 2012-08-29 MED ORDER — GLYCOPYRROLATE 0.2 MG/ML IJ SOLN
INTRAMUSCULAR | Status: DC | PRN
  Administered 2012-08-29: 0.4 mg via INTRAVENOUS

## 2012-08-29 MED ORDER — LACTATED RINGERS IV SOLN
INTRAVENOUS | Status: DC | PRN
  Administered 2012-08-29: 21:00:00 via INTRAVENOUS

## 2012-08-29 MED ORDER — HYDROMORPHONE HCL PF 1 MG/ML IJ SOLN
0.2500 mg | INTRAMUSCULAR | Status: DC | PRN
  Administered 2012-08-29: 0.25 mg via INTRAVENOUS
  Administered 2012-08-29 – 2012-08-30 (×3): 0.5 mg via INTRAVENOUS
  Filled 2012-08-29 (×3): qty 1

## 2012-08-29 MED ORDER — PROMETHAZINE HCL 25 MG/ML IJ SOLN: 6.2500 mg | INTRAMUSCULAR | Status: DC | PRN

## 2012-08-29 MED ORDER — PROPOFOL 10 MG/ML IV EMUL
INTRAVENOUS | Status: DC | PRN
  Administered 2012-08-29: 100 mg via INTRAVENOUS

## 2012-08-29 MED ORDER — ACETAMINOPHEN 10 MG/ML IV SOLN
INTRAVENOUS | Status: DC | PRN
  Administered 2012-08-29: 1000 mg via INTRAVENOUS

## 2012-08-29 SURGICAL SUPPLY — 50 items
BAG SPEC THK2 15X12 ZIP CLS (MISCELLANEOUS) ×1
BAG ZIPLOCK 12X15 (MISCELLANEOUS) ×2 IMPLANT
BLADE SAW SGTL 18X1.27X75 (BLADE) ×2 IMPLANT
CLOTH BEACON ORANGE TIMEOUT ST (SAFETY) ×2 IMPLANT
DRAPE INCISE IOBAN 85X60 (DRAPES) ×2 IMPLANT
DRAPE ORTHO SPLIT 77X108 STRL (DRAPES) ×4
DRAPE POUCH INSTRU U-SHP 10X18 (DRAPES) ×2 IMPLANT
DRAPE SURG 17X11 SM STRL (DRAPES) ×2 IMPLANT
DRAPE SURG ORHT 6 SPLT 77X108 (DRAPES) ×2 IMPLANT
DRAPE U-SHAPE 47X51 STRL (DRAPES) ×2 IMPLANT
DRSG AQUACEL AG ADV 3.5X10 (GAUZE/BANDAGES/DRESSINGS) ×1 IMPLANT
DRSG MEPILEX BORDER 4X4 (GAUZE/BANDAGES/DRESSINGS) ×1 IMPLANT
DRSG MEPILEX BORDER 4X8 (GAUZE/BANDAGES/DRESSINGS) ×1 IMPLANT
DURAPREP 26ML APPLICATOR (WOUND CARE) ×2 IMPLANT
ELECT BLADE TIP CTD 4 INCH (ELECTRODE) ×2 IMPLANT
ELECT REM PT RETURN 9FT ADLT (ELECTROSURGICAL) ×2
ELECTRODE REM PT RTRN 9FT ADLT (ELECTROSURGICAL) ×1 IMPLANT
EVACUATOR 1/8 PVC DRAIN (DRAIN) ×1 IMPLANT
FACESHIELD LNG OPTICON STERILE (SAFETY) ×6 IMPLANT
GLOVE BIOGEL PI IND STRL 6 (GLOVE) IMPLANT
GLOVE BIOGEL PI IND STRL 7.5 (GLOVE) ×1 IMPLANT
GLOVE BIOGEL PI IND STRL 8 (GLOVE) ×1 IMPLANT
GLOVE BIOGEL PI INDICATOR 6 (GLOVE) ×1
GLOVE BIOGEL PI INDICATOR 7.5 (GLOVE) ×1
GLOVE BIOGEL PI INDICATOR 8 (GLOVE) ×1
GLOVE ECLIPSE 8.0 STRL XLNG CF (GLOVE) ×1 IMPLANT
GLOVE ORTHO TXT STRL SZ7.5 (GLOVE) ×4 IMPLANT
GLOVE SURG ORTHO 8.0 STRL STRW (GLOVE) ×2 IMPLANT
GLOVE SURG SS PI 6.5 STRL IVOR (GLOVE) ×1 IMPLANT
GLOVE SURG SS PI 8.5 STRL IVOR (GLOVE) ×2
GLOVE SURG SS PI 8.5 STRL STRW (GLOVE) IMPLANT
GOWN BRE IMP PREV XXLGXLNG (GOWN DISPOSABLE) ×2 IMPLANT
GOWN STRL NON-REIN LRG LVL3 (GOWN DISPOSABLE) ×3 IMPLANT
GOWN STRL REIN XL XLG (GOWN DISPOSABLE) ×1 IMPLANT
HANDPIECE INTERPULSE COAX TIP (DISPOSABLE)
IMMOBILIZER KNEE 20 (SOFTGOODS)
IMMOBILIZER KNEE 20 THIGH 36 (SOFTGOODS) IMPLANT
KIT BASIN OR (CUSTOM PROCEDURE TRAY) ×2 IMPLANT
MANIFOLD NEPTUNE II (INSTRUMENTS) ×2 IMPLANT
PACK TOTAL JOINT (CUSTOM PROCEDURE TRAY) ×2 IMPLANT
POSITIONER SURGICAL ARM (MISCELLANEOUS) ×2 IMPLANT
SET HNDPC FAN SPRY TIP SCT (DISPOSABLE) IMPLANT
STRIP CLOSURE SKIN 1/2X4 (GAUZE/BANDAGES/DRESSINGS) ×2 IMPLANT
SUT ETHIBOND NAB CT1 #1 30IN (SUTURE) ×1 IMPLANT
SUT MNCRL AB 4-0 PS2 18 (SUTURE) ×2 IMPLANT
SUT VIC AB 1 CT1 36 (SUTURE) ×4 IMPLANT
SUT VIC AB 2-0 CT1 27 (SUTURE) ×4
SUT VIC AB 2-0 CT1 TAPERPNT 27 (SUTURE) ×2 IMPLANT
TOWEL OR 17X26 10 PK STRL BLUE (TOWEL DISPOSABLE) ×4 IMPLANT
TRAY FOLEY CATH 14FRSI W/METER (CATHETERS) ×1 IMPLANT

## 2012-08-29 NOTE — Progress Notes (Signed)
Patient ID: Christine Lyons, female   DOB: [birth year], 78 y.o.   MRN: [id number]  Left femoral neck fracture  Seen and examined with family in room.  Based on discussion with Dr. Ranell Patrick and in follow up today, the plan is to perform left hip hemiarthroplasty.  She is most comfortable with this option as opposed to screw fixation and the possibilities of having to have further surgery.  She would rather have the opportunity to get up and move without weight bearing restriction.  NPO Labs reviewed and stable Surgery today as add on.

## 2012-08-29 NOTE — Anesthesia Preprocedure Evaluation (Signed)
Anesthesia Evaluation  Patient identified by MRN, date of birth, ID band Patient awake    Reviewed: Allergy & Precautions, H&P , NPO status , Patient's Chart, lab work & pertinent test results, reviewed documented beta blocker date and time   Airway Mallampati: II TM Distance: >3 FB Neck ROM: full    Dental No notable dental hx.    Pulmonary neg pulmonary ROS,  breath sounds clear to auscultation  Pulmonary exam normal       Cardiovascular Exercise Tolerance: Good hypertension, On Medications Rhythm:regular Rate:Normal     Neuro/Psych Stroke/TIA 1999 CVA negative psych ROS   GI/Hepatic Neg liver ROS, GERD-  ,  Endo/Other  negative endocrine ROS  Renal/GU negative Renal ROS  negative genitourinary   Musculoskeletal   Abdominal   Peds  Hematology negative hematology ROS (+)   Anesthesia Other Findings Recent plavix. Plan general.  Reproductive/Obstetrics negative OB ROS                           Anesthesia Physical Anesthesia Plan  ASA: III  Anesthesia Plan: General ETT   Post-op Pain Management:    Induction:   Airway Management Planned:   Additional Equipment:   Intra-op Plan:   Post-operative Plan:   Informed Consent: I have reviewed the patients History and Physical, chart, labs and discussed the procedure including the risks, benefits and alternatives for the proposed anesthesia with the patient or authorized representative who has indicated his/her understanding and acceptance.   Dental Advisory Given  Plan Discussed with: CRNA  Anesthesia Plan Comments:         Anesthesia Quick Evaluation

## 2012-08-29 NOTE — Progress Notes (Addendum)
Patient BP = 182/74 p=81 one episode of brady of 37, notified Dr. Betti Cruz, orders received.  Patient continues to complain of a bit of nausea, room darkened, ice to right hip, cold cloth to forehead.  Family in room with patient

## 2012-08-29 NOTE — Progress Notes (Signed)
Nutrition Consult Note  Body mass index is 26.99 kg/(m^2). Pt meets criteria for overweight based on current BMI.   Pt reports eating well PTA, 3 meals/day with stable weight and no problems chewing/swallowing, not on any nutritional supplements. Pt reports good appetite PTA. Pt NPO for left hip hemiarthroplasty today, expect diet will be advanced afterwards. Labs and medications reviewed.   No nutrition interventions warranted at this time. If nutrition issues arise, please consult RD.   Levon Hedger MS, RD, LDN [phone] Pager [phone] After Hours Pager

## 2012-08-29 NOTE — Progress Notes (Signed)
Phoned Operating Room to check on when patient being transported.  Informed that surgery now another hour/hour and half, would advise then.  Informed patient's daughter.  Patient is now sleeping , appears comfortable.  BP cuff still in place on right arm and daughter asked that we not disturb patient to remove.

## 2012-08-29 NOTE — Progress Notes (Signed)
Patient sleeping, BP trending down, daughter and grand daughter in room with patient.

## 2012-08-29 NOTE — Transfer of Care (Signed)
Immediate Anesthesia Transfer of Care Note  Patient: Christine Lyons  Procedure(s) Performed: Procedure(s) (LRB) with comments: ARTHROPLASTY BIPOLAR HIP (Left) - LEFT HIP HEMIARTHROPLASTY  Patient Location: PACU  Anesthesia Type:General  Level of Consciousness: awake, alert  and patient cooperative  Airway & Oxygen Therapy: Patient Spontanous Breathing and Patient connected to nasal cannula oxygen  Post-op Assessment: Report given to PACU RN and Post -op Vital signs reviewed and stable  Post vital signs: Reviewed and stable  Complications: No apparent anesthesia complications

## 2012-08-29 NOTE — Consult Note (Signed)
CARDIOLOGY CONSULT NOTE   Patient ID: Christine Lyons MRN: [id number] DOB/AGE: [birth year] 76 y.o.  Admit date: 08/28/2012  Primary Physician   [name], MD Primary Cardiologist   New  Reason for Consultation:  Bradycardia, Pre-op eval  Christine:MVHQ E Lyons is a 78 y.o. female with no history of CAD.  She was admitted from independent living at [facility] after a fall with a left hip fracture. She had some significant bradycardia this am and cardiology is asked to address this. She is for surgery, possibly today with cardiology evaluation requested pre-op for this as well.  At 6:30 this morning, Christine Lyons heart rate decreased briefly to the 20s. Upon telemetry review, she was in sinus rhythm with some dropped QRS complexes and a slowing atrial rate as well. According to the patient, she was asleep at that time and does not recall any symptoms. She denies any history of waking up gasping for breath or any other problems.   Christine Lyons never gets chest pain. She denies exertional shortness of breath. She admits to balance problems and doesn't do stairs but says she can walk up a hill without difficulty. She walks to meals at [place] and walks her dog daily.   At first, she said she fell because she was dizzy. Then, after further discussion, she feels her balance was poor and there was no dizziness. Her son-in-law is in the room and contacted her daughter by phone. He says they have noticed balance problems but have never heard her complain of dizziness or felt that she was dizzy.  She has no current complaints of pain or shortness of breath. She has never had palpitations. She has never had syncope.   Past Medical History  Diagnosis Date  . Hyperlipidemia   . Hypertension   . Stroke 1999    TIA in [facility]     Past Surgical History  Procedure Date  . Appendectomy   . Cholecystectomy   . Abdominal hysterectomy     with BSO for dysfunctional menses  . Tonsillectomy and  adenoidectomy   . Laparoscopic gastric banding     to prevent GERD  . Colonscopy     diverticulosis  . G 7 p 6     Allergies  Allergen Reactions  . Codeine     nausea    I have reviewed the patient's current medications    . amLODipine  5 mg Oral Daily  . calcium citrate  1 tablet Oral Daily  .  ceFAZolin (ANCEF) IV  2 g Intravenous 60 min Pre-Op  . chlorhexidine  60 mL Topical Once  . clopidogrel  75 mg Oral Daily  . enoxaparin (LOVENOX) injection  30 mg Subcutaneous Q24H  . irbesartan  75 mg Oral Daily  . simvastatin  20 mg Oral q1800  . sodium chloride  3 mL Intravenous Q12H     HYDROcodone-acetaminophen, morphine injection, ondansetron  Prior to Admission medications   Medication Sig Start Date End Date Taking? Authorizing Provider  amLODipine (NORVASC) 5 MG tablet Take 5 mg by mouth daily.   Yes Historical Provider, MD  calcium citrate (CALCITRATE - DOSED IN MG ELEMENTAL CALCIUM) 950 MG tablet Take 1 tablet by mouth daily.   Yes Historical Provider, MD  clopidogrel (PLAVIX) 75 MG tablet Take 75 mg by mouth daily.   Yes Historical Provider, MD  Multiple Vitamins-Minerals (ICAPS PO) Take by mouth.   Yes Historical Provider, MD  olmesartan (BENICAR) 5 MG tablet Take 5 mg by  mouth daily.   Yes Historical Provider, MD  simvastatin (ZOCOR) 20 MG tablet Take 20 mg by mouth every evening.   Yes Historical Provider, MD  losartan (COZAAR) 100 MG tablet Take 1 tablet (100 mg total) by mouth daily. 01/06/11 01/06/12  Pecola Lawless, MD  simvastatin (ZOCOR) 20 MG tablet Take 1 tablet (20 mg total) by mouth every evening. 01/06/11 01/06/12  Pecola Lawless, MD     History   Social History  . Marital Status: Married    Spouse Name: N/A    Number of Children: N/A  . Years of Education: N/A   Occupational History  . Retired.    Social History Main Topics  . Smoking status: Never Smoker   . Smokeless tobacco: Never Used  . Alcohol Use: 2.5 oz/week    5 drink(s) per week      Comment: Glass of wine almost every day  . Drug Use: No  . Sexually Active: No   Other Topics Concern  . Not on file   Social History Narrative   Lives independently at [facility]. Retired.    Family Status  Relation Status Death Age  . Mother Deceased   . Father Deceased   . Brother Deceased    Family History  Problem Relation Age of Onset  . Hypertension Mother   . Heart disease Brother     died of an MI, late 33s  . Dementia Father      ROS: She has not had any illnesses, fever chills. She has occasional reflux symptoms but they are better after the gastric banding and she never has melena. Full 14 point review of systems complete and found to be negative unless listed above.  Physical Exam: Blood pressure 148/66, pulse 73, temperature 98.2 F (36.8 C), temperature source Oral, resp. rate 20, height 5\' 1"  (1.549 m), weight 142 lb 13.7 oz (64.8 kg), SpO2 94.00%.  General: Well developed, well nourished, elderly female in no acute distress Head: Eyes PERRLA, No xanthomas.   Normocephalic and atraumatic, oropharynx without edema or exudate. Dentition: Poor Lungs: Few rales at bilateral bases but no crackles, rhonchi or wheeze Heart: HRRR S1 S2, no rub/gallop, no significant  murmur. pulses are 2+ all 4 extrem.   Neck: No carotid bruits. No lymphadenopathy.  JVD not elevated Abdomen: Bowel sounds present, abdomen soft and non-tender without masses or hernias noted. Msk: No joint effusions. Left lower extremity is outwardly rotated and not manipulated Extremities: No clubbing or cyanosis. Trace bilateral lower extremity edema.  Neuro: Alert and oriented X 3. No focal deficits noted. Psych:  Good affect, responds appropriately Skin: No rashes or lesions noted.  Labs:   Lab Results  Component Value Date   WBC 9.4 08/29/2012   HGB 11.8* 08/29/2012   HCT 35.5* 08/29/2012   MCV 98.6 08/29/2012   PLT 166 08/29/2012    Basename 08/28/12 1205  INR 1.02     Lab 08/29/12  0520  NA 135  K 3.6  CL 99  CO2 23  BUN 9  CREATININE 0.43*  CALCIUM 8.8  PROT --  BILITOT --  ALKPHOS --  ALT --  AST --  GLUCOSE 104*    Basename 08/28/12 1205  CKTOTAL --  CKMB --  TROPONINI <0.30   TSH  Date/Time Value Range Status  08/28/2012 12:05 PM 2.356  0.350 - 4.500 uIU/mL Final    ECG: 28-Aug-2012 12:15:50 [facility] ED ROUTINE RECORD SINUS RHYTHM ~ normal P axis,  V-rate 50- 99 CONSIDER LEFT VENTRICULAR HYPERTROPHY ~ (R aVL+S V3) >2.74mV Vent. rate 72 BPM PR interval 168 Christine QRS duration 92 Christine QT/QTc 412/451 Christine P-R-T axes 48 -2 51  Radiology:  Dg Chest 1 View 08/28/2012  *RADIOLOGY REPORT*  Clinical Data: Left hip pain status post fall today.  CHEST - 1 VIEW  Comparison: 11/16/2006 portable examination.  Findings: 1334 hours.  Positioning is lordotic.  Allowing for this, the heart size and mediastinal contours are stable.  Right perihilar fullness may be secondary to positioning.  The lungs are otherwise clear.  There is no pleural effusion.  Multiple surgical clips are present within the left upper quadrant of the abdomen. Patient is status post lower thoracic spinal augmentation.  There is a possible loose body within the left superior subscapularis recess.  IMPRESSION:  1.  No definite acute cardiopulmonary process. 2.  Altered appearance of the right hilum may be secondary to positioning.  Radiographic follow-up with attention to this area is recommended to exclude a developing nodule or infiltrate.   Original Report Authenticated By: Carey Bullocks, M.D.    Dg Hip Complete Left 08/28/2012  *RADIOLOGY REPORT*  Clinical Data: Fall  LEFT HIP - COMPLETE 2+ VIEW  Comparison: 10/13/2005  Findings: Mild osteopenia.  No acute fracture and no dislocation. Mild degenerative change of the SI and hip joints.  IMPRESSION: No acute bony pathology.   Original Report Authenticated By: Jolaine Click, M.D.    Ct Hip Left Wo Contrast 08/28/2012  *RADIOLOGY  REPORT*  Clinical Data: Status post fall.  Left hip pain.  Question fracture.  CT OF THE LEFT HIP WITHOUT CONTRAST  Technique:  Multidetector CT imaging was performed according to the standard protocol. Multiplanar CT image reconstructions were also generated.  Comparison: Plain films earlier this same date.  Findings: The patient has a mildly impacted subcapital fracture of the left hip.  There is an associated small hip joint effusion.  No other fracture is identified.  No dislocation.  There is no acute or focal abnormality of the visualized intrapelvic contents.  IMPRESSION: Mildly impacted subcapital fracture left hip.   Original Report Authenticated By: [name], M.D.     ASSESSMENT AND PLAN:   The patient was seen today by , the patient evaluated and the data reviewed.  1. Bradycardia: Strips are available for review. Pt was asleep and was asymptomatic. She has no history of syncope. Her baseline ECG has no acute abnormalities. Dr [name] discussed the case with Dr Betti Cruz and we will continue to follow.   Dr [name] discussed the bradycardia with Dr Ladona Ridgel. If she has problems during surgery, he recommends using Isuprel at 1-2 mcg/min.   2. Pre-op eval - Pt has no history of ischemic symptoms, no limitation of activity except by balance issues. She would not benefit from delaying surgery and is at acceptable risk to proceed.  Otherwise, per primary MD and orthopedics. Principal Problem:  *Hip fracture Active Problems:  HYPERTENSION   Signed: Theodore Demark 08/29/2012, 11:55 AM Co-Sign MD I have taken a history, reviewed medications, allergies, PMH, SH, FH, and reviewed ROS and examined the patient.  She is very bradycardic but was asleep, has a narrow complex and a normal 12 lead EKG except for a borderline 1st degree AVB. Her telemetry strips show some p waves that are not conducted so this could be technically Mobitz Type 2 2nd degree AVB. However, when awake she has a normal heart  rate and is totally asymptomatic.  Discussed with Dr Ladona Ridgel of EP. We both agree there is no indication for a pacemaker. Plan as outlined above. Discussed with patient in detail.  Arella Blinder C. [name], MD, [facility] Pager:  [phone]

## 2012-08-29 NOTE — Progress Notes (Signed)
[facility] PROGRESS NOTE  Christine Lyons:[id number] DOB: Apr 23, 1925 DOA: 08/28/2012 PCP: [name], MD  Assessment/Plan: Mildly impacted subcapital fracture left hip  Likely secondary to mechanical fall.  Orthopedic service planning on surgery today.  Further management as per orthopedic service.  Bradycardia Patient this morning had an episode of bradycardia with heart rate in the mid-20s.  Patient on telemetry had p waves with nonconducting QRS complexes, concern for possible Mobitz II. Cardiology evaluated the patient does not see a need for pacemaker at this time.  Appreciate cardiology input. Per cardiology "If she has problems during surgery, Dr. [name] recommended using Isuprel at 1-2 mcg/min."  Hypertension Stable.  Losartan has been held patient already on olmesartan, continue to hold losartan as patient does not need to be on two ARBs.  Continue amlodipine.  Hyperlipidemia Continue simvastatin at discharge.  History of TIA Continue Plavix.  Osteoporosis Continue calcium.  Prophylaxis Lovenox.  Code Status: Full  Family Communication: No family at bedside.   Disposition Plan: Admit to telemetry    Consultants:  Cardiology, Dr. [name], Dr. [name]  Procedures:  None  Antibiotics:  None.  HPI/Subjective: No specific complaints.  Has pain only when she moves her leg.  Objective: Filed Vitals:   08/28/12 2348 08/29/12 0405 08/29/12 0612 08/29/12 0616  BP:  158/53 148/66   Pulse:  78 73   Temp:  99.5 F (37.5 C) 98.2 F (36.8 C)   TempSrc:  Oral Oral   Resp: 18 20 20    Height:      Weight:      SpO2: 93% 90% 89% 94%    Intake/Output Summary (Last 24 hours) at 08/29/12 0833 Last data filed at 08/29/12 0600  Gross per 24 hour  Intake      0 ml  Output   1100 ml  Net  -1100 ml   Filed Weights   08/28/12 1810  Weight: 64.8 kg (142 lb 13.7 oz)    Exam: Physical Exam: General: Awake, Oriented, No acute distress. HEENT:  EOMI. Neck: Supple CV: S1 and S2 Lungs: Clear to ascultation bilaterally Abdomen: Soft, Nontender, Nondistended, +bowel sounds. Ext: Good pulses. Trace edema.  Data Reviewed: Basic Metabolic Panel:  Lab 08/29/12 8119 08/28/12 1205  NA 135 136  K 3.6 4.2  CL 99 98  CO2 23 22  GLUCOSE 104* 117*  BUN 9 14  CREATININE 0.43* 0.58  CALCIUM 8.8 9.5  MG -- --  PHOS -- --   Liver Function Tests: No results found for this basename: AST:5,ALT:5,ALKPHOS:5,BILITOT:5,PROT:5,ALBUMIN:5 in the last 168 hours No results found for this basename: LIPASE:5,AMYLASE:5 in the last 168 hours No results found for this basename: AMMONIA:5 in the last 168 hours CBC:  Lab 08/29/12 0520 08/28/12 1205  WBC 9.4 8.9  NEUTROABS -- --  HGB 11.8* 12.7  HCT 35.5* 37.6  MCV 98.6 96.4  PLT 166 234   Cardiac Enzymes:  Lab 08/28/12 1205  CKTOTAL --  CKMB --  CKMBINDEX --  TROPONINI <0.30   BNP (last 3 results) No results found for this basename: PROBNP:3 in the last 8760 hours CBG:  Lab 08/29/12 0739 08/29/12 0404 08/28/12 2341  GLUCAP 96 112* 118*    Recent Results (from the past 240 hour(s))  SURGICAL PCR SCREEN     Status: Normal   Collection Time   08/28/12 11:42 PM      Component Value Range Status Comment   MRSA, PCR NEGATIVE  NEGATIVE Final  Staphylococcus aureus NEGATIVE  NEGATIVE Final      Studies: Dg Chest 1 View  08/28/2012  *RADIOLOGY REPORT*  Clinical Data: Left hip pain status post fall today.  CHEST - 1 VIEW  Comparison: 11/16/2006 portable examination.  Findings: 1334 hours.  Positioning is lordotic.  Allowing for this, the heart size and mediastinal contours are stable.  Right perihilar fullness may be secondary to positioning.  The lungs are otherwise clear.  There is no pleural effusion.  Multiple surgical clips are present within the left upper quadrant of the abdomen. Patient is status post lower thoracic spinal augmentation.  There is a possible loose body within the  left superior subscapularis recess.  IMPRESSION:  1.  No definite acute cardiopulmonary process. 2.  Altered appearance of the right hilum may be secondary to positioning.  Radiographic follow-up with attention to this area is recommended to exclude a developing nodule or infiltrate.   Original Report Authenticated By: Carey Bullocks, M.D.    Dg Hip Complete Left  08/28/2012  *RADIOLOGY REPORT*  Clinical Data: Fall  LEFT HIP - COMPLETE 2+ VIEW  Comparison: 10/13/2005  Findings: Mild osteopenia.  No acute fracture and no dislocation. Mild degenerative change of the SI and hip joints.  IMPRESSION: No acute bony pathology.   Original Report Authenticated By: Jolaine Click, M.D.    Ct Hip Left Wo Contrast  08/28/2012  *RADIOLOGY REPORT*  Clinical Data: Status post fall.  Left hip pain.  Question fracture.  CT OF THE LEFT HIP WITHOUT CONTRAST  Technique:  Multidetector CT imaging was performed according to the standard protocol. Multiplanar CT image reconstructions were also generated.  Comparison: Plain films earlier this same date.  Findings: The patient has a mildly impacted subcapital fracture of the left hip.  There is an associated small hip joint effusion.  No other fracture is identified.  No dislocation.  There is no acute or focal abnormality of the visualized intrapelvic contents.  IMPRESSION: Mildly impacted subcapital fracture left hip.   Original Report Authenticated By: [name], M.D.     Scheduled Meds:   . amLODipine  5 mg Oral Daily  . calcium citrate  1 tablet Oral Daily  .  ceFAZolin (ANCEF) IV  2 g Intravenous 60 min Pre-Op  . chlorhexidine  60 mL Topical Once  . clopidogrel  75 mg Oral Daily  . enoxaparin (LOVENOX) injection  30 mg Subcutaneous Q24H  . irbesartan  75 mg Oral Daily  . simvastatin  20 mg Oral q1800  . sodium chloride  3 mL Intravenous Q12H   Continuous Infusions:   Principal Problem:  *Hip fracture Active Problems:  HYPERLIPIDEMIA  HYPERTENSION   GERD  LOW BACK PAIN SYNDROME  TRANSIENT ISCHEMIC ATTACK, HX OF  Bradycardia    Time spent: 25 mins    [name] A  [facility] Pager [phone]. If 7PM-7AM, please contact night-coverage at [email or web address], password [email or web address] 08/29/2012, 8:33 AM  LOS: 1 day

## 2012-08-29 NOTE — Progress Notes (Addendum)
Met with Pt to discuss d/c plans.  Pt wanting to go to SNF at [facility].    Answered questions re: transfer process.  CSW thanked Pt for her time.  LM for Admissions Coordinator at [facility].  CSW to continue to follow.  Providence Crosby, LCSWA Clinical Social Work [phone]

## 2012-08-29 NOTE — Progress Notes (Signed)
Informed patient that the Operating Room states she will be going to surgery around 6pm

## 2012-08-29 NOTE — Progress Notes (Signed)
   Subjective: Left hip femoral neck fracture  Patient reports pain as mild at rest, increases significantly with any motion. No events throughout the night. Understands the procedure as well as the risks, benefits and expectations and wishes to proceed.   Objective:   VITALS:   Filed Vitals:   08/29/12 0612  BP: 148/66  Pulse: 73  Temp: 98.2 F (36.8 C)  Resp: 20    Neurovascular intact Dorsiflexion/Plantar flexion intact No cellulitis present Compartment soft  LABS  Basename 08/29/12 0520 08/28/12 1205  HGB 11.8* 12.7  HCT 35.5* 37.6  WBC 9.4 8.9  PLT 166 234     Basename 08/29/12 0520 08/28/12 1205  NA 135 136  K 3.6 4.2  BUN 9 14  CREATININE 0.43* 0.58  GLUCOSE 104* 117*     Assessment/Plan: Left hip femoral neck fracture  NPO since the morning. No change in condition and still would like to proceed with surgery. Risks, benefits and expectations were discussed with the patient. Patient understand the risks, benefits and expectations and wishes to proceed with a left hip hemi arthroplasty.     Anastasio Auerbach Wes Lezotte   PAC  08/29/2012, 8:48 AM

## 2012-08-29 NOTE — Anesthesia Postprocedure Evaluation (Signed)
  Anesthesia Post-op Note  Patient: Christine Lyons  Procedure(s) Performed: Procedure(s) (LRB): ARTHROPLASTY BIPOLAR HIP (Left)  Patient Location: PACU  Anesthesia Type: General  Level of Consciousness: awake and alert   Airway and Oxygen Therapy: Patient Spontanous Breathing  Post-op Pain: mild  Post-op Assessment: Post-op Vital signs reviewed, Patient's Cardiovascular Status Stable, Respiratory Function Stable, Patent Airway and No signs of Nausea or vomiting  Last Vitals:  Filed Vitals:   08/29/12 2330  BP: 110/36  Pulse:   Temp:   Resp:     Post-op Vital Signs: stable   Complications: No apparent anesthesia complications

## 2012-08-30 DIAGNOSIS — I10 Essential (primary) hypertension: Secondary | ICD-10-CM | POA: Diagnosis present

## 2012-08-30 DIAGNOSIS — I959 Hypotension, unspecified: Secondary | ICD-10-CM

## 2012-08-30 DIAGNOSIS — G459 Transient cerebral ischemic attack, unspecified: Secondary | ICD-10-CM | POA: Clinically undetermined

## 2012-08-30 LAB — URINE CULTURE
Colony Count: NO GROWTH
Culture: NO GROWTH

## 2012-08-30 LAB — GLUCOSE, CAPILLARY
Glucose-Capillary: 117 mg/dL — ABNORMAL HIGH (ref 70–99)
Glucose-Capillary: 132 mg/dL — ABNORMAL HIGH (ref 70–99)
Glucose-Capillary: 156 mg/dL — ABNORMAL HIGH (ref 70–99)
Glucose-Capillary: 164 mg/dL — ABNORMAL HIGH (ref 70–99)

## 2012-08-30 LAB — CBC
HCT: 29.3 % — ABNORMAL LOW (ref 36.0–46.0)
Hemoglobin: 9.7 g/dL — ABNORMAL LOW (ref 12.0–15.0)
RDW: 14.2 % (ref 11.5–15.5)
WBC: 10.7 10*3/uL — ABNORMAL HIGH (ref 4.0–10.5)

## 2012-08-30 LAB — BASIC METABOLIC PANEL
BUN: 17 mg/dL (ref 6–23)
Chloride: 97 mEq/L (ref 96–112)
GFR calc Af Amer: 88 mL/min — ABNORMAL LOW (ref >90)
Potassium: 3.7 mEq/L (ref 3.5–5.1)
Sodium: 132 mEq/L — ABNORMAL LOW (ref 135–145)

## 2012-08-30 MED ORDER — PHENOL 1.4 % MT LIQD
1.0000 | OROMUCOSAL | Status: DC | PRN
  Filled 2012-08-30: qty 177

## 2012-08-30 MED ORDER — POLYETHYLENE GLYCOL 3350 17 G PO PACK: 17.0000 g | PACK | Freq: Every day | ORAL | Status: DC | PRN

## 2012-08-30 MED ORDER — MENTHOL 3 MG MT LOZG
1.0000 | LOZENGE | OROMUCOSAL | Status: DC | PRN
  Filled 2012-08-30 (×2): qty 9

## 2012-08-30 MED ORDER — METOCLOPRAMIDE HCL 5 MG/ML IJ SOLN: 5.0000 mg | Freq: Three times a day (TID) | INTRAMUSCULAR | Status: DC | PRN

## 2012-08-30 MED ORDER — ACETAMINOPHEN 325 MG PO TABS: 650.0000 mg | ORAL_TABLET | Freq: Four times a day (QID) | ORAL | Status: DC | PRN

## 2012-08-30 MED ORDER — DOCUSATE SODIUM 100 MG PO CAPS
100.0000 mg | ORAL_CAPSULE | Freq: Two times a day (BID) | ORAL | Status: DC
  Administered 2012-08-30 – 2012-08-31 (×3): 100 mg via ORAL
  Filled 2012-08-30 (×4): qty 1

## 2012-08-30 MED ORDER — ACETAMINOPHEN 650 MG RE SUPP: 650.0000 mg | Freq: Four times a day (QID) | RECTAL | Status: DC | PRN

## 2012-08-30 MED ORDER — CLOPIDOGREL BISULFATE 75 MG PO TABS
75.0000 mg | ORAL_TABLET | Freq: Every day | ORAL | Status: DC
  Administered 2012-08-30 – 2012-08-31 (×2): 75 mg via ORAL
  Filled 2012-08-30 (×2): qty 1

## 2012-08-30 MED ORDER — SODIUM CHLORIDE 0.9 % IV SOLN
INTRAVENOUS | Status: AC
  Administered 2012-08-31: 04:00:00 via INTRAVENOUS

## 2012-08-30 MED ORDER — BISACODYL 10 MG RE SUPP: 10.0000 mg | Freq: Every day | RECTAL | Status: DC | PRN

## 2012-08-30 MED ORDER — POLYETHYLENE GLYCOL 3350 17 G PO PACK
17.0000 g | PACK | Freq: Every day | ORAL | Status: DC | PRN
  Filled 2012-08-30: qty 1

## 2012-08-30 MED ORDER — SODIUM CHLORIDE 0.9 % IV BOLUS (SEPSIS)
500.0000 mL | Freq: Once | INTRAVENOUS | Status: AC
  Administered 2012-08-30: 500 mL via INTRAVENOUS

## 2012-08-30 MED ORDER — ONDANSETRON HCL 4 MG PO TABS: 4.0000 mg | ORAL_TABLET | Freq: Four times a day (QID) | ORAL | Status: DC | PRN

## 2012-08-30 MED ORDER — METOCLOPRAMIDE HCL 10 MG PO TABS: 5.0000 mg | ORAL_TABLET | Freq: Three times a day (TID) | ORAL | Status: DC | PRN

## 2012-08-30 MED ORDER — FERROUS SULFATE 325 (65 FE) MG PO TABS: 325.0000 mg | ORAL_TABLET | Freq: Three times a day (TID) | ORAL | Status: DC

## 2012-08-30 MED ORDER — HYDROCODONE-ACETAMINOPHEN 5-325 MG PO TABS: 1.0000 | ORAL_TABLET | ORAL | Status: DC | PRN

## 2012-08-30 MED ORDER — FLEET ENEMA 7-19 GM/118ML RE ENEM: 1.0000 | ENEMA | Freq: Once | RECTAL | Status: AC | PRN

## 2012-08-30 MED ORDER — MORPHINE SULFATE 2 MG/ML IJ SOLN
0.5000 mg | INTRAMUSCULAR | Status: DC | PRN
  Administered 2012-08-31 (×2): 0.5 mg via INTRAVENOUS
  Filled 2012-08-30 (×2): qty 1

## 2012-08-30 MED ORDER — ONDANSETRON HCL 4 MG/2ML IJ SOLN: 4.0000 mg | Freq: Four times a day (QID) | INTRAMUSCULAR | Status: DC | PRN

## 2012-08-30 MED ORDER — CEFAZOLIN SODIUM-DEXTROSE 2-3 GM-% IV SOLR
2.0000 g | Freq: Four times a day (QID) | INTRAVENOUS | Status: AC
  Administered 2012-08-30 (×2): 2 g via INTRAVENOUS
  Filled 2012-08-30 (×2): qty 50

## 2012-08-30 MED ORDER — DSS 100 MG PO CAPS: 100.0000 mg | ORAL_CAPSULE | Freq: Two times a day (BID) | ORAL | Status: DC

## 2012-08-30 MED ORDER — FERROUS SULFATE 325 (65 FE) MG PO TABS
325.0000 mg | ORAL_TABLET | Freq: Three times a day (TID) | ORAL | Status: DC
  Administered 2012-08-30 – 2012-08-31 (×4): 325 mg via ORAL
  Filled 2012-08-30 (×7): qty 1

## 2012-08-30 NOTE — Progress Notes (Signed)
Per MD, Pt not medically ready to d/c.  CSW to continue to follow.  Providence Crosby, LCSWA Clinical Social Work [phone]

## 2012-08-30 NOTE — Discharge Summary (Signed)
Physician Discharge Summary  Christine Lyons ZOX:[id number] DOB: 1925/04/02 DOA: 08/28/2012  PCP: [name], MD  Admit date: 08/28/2012 Discharge date: 08/31/2012  Time spent: 40 minutes  Recommendations for Outpatient Follow-up:  1. Follow up with PMD. 2. Follow up with SNF MD. 3. Follow up with Dr Durene Romans, orthopedic surgeon.  4. Follow up with Dr Valera Castle in 4 weeks. Dr Anola Gurney office will arrange event monitor and 2D Echocardiogram as outpatient.  Discharge Diagnoses:  Principal Problem:  *Hip fracture Active Problems:  HYPERLIPIDEMIA  HYPERTENSION  GERD  LOW BACK PAIN SYNDROME  TRANSIENT ISCHEMIC ATTACK, HX OF  Bradycardia  HTN (hypertension)  Hypotension  TIA (transient ischemic attack)   Discharge Condition: Satisfactory.   Diet recommendation: Heart-Healthy.   Filed Weights   08/28/12 1810  Weight: 64.8 kg (142 lb 13.7 oz)    History of present illness:  78 year old female with history of hypertension, dyslipidemia, s/p CVA/TIA, Diverticulosis, s/p laparoscopic gastric banding, who presents to [facility] long emergency department with left hip pain, after a fall at home. She reports she was trying to get to the bathroom, she fell off the toilet and landed on her left hip. A mildly impacted subcapital fracture left hip noted on x-ray. Patient was admitted for further management.    Hospital Course:  1. Mildly impacted subcapital fracture left hip: This was secondary to a mechanical fall. Dr Durene Romans provided orthopedic consultation, and patient is s/p left hip hemiarthroplasty on 08/29/12. Patient remained clinically stable, and SNF rehab is recommended.  2. Bradycardia: Patient in AM of 08/29/12, had an episode of bradycardia with heart rate in the mid-20s. Telemetry revealed P-waves with nonconducting QRS complexes, concerning for possible Mobitz II. Dr Maisie Fus wall provided cardiology consultation, and has opined that although patient was very  bradycardic, she was asleep at the time, had a narrow complex and a normal 12 lead EKG except for a borderline 1st degree AVB. When awake she has a normal heart rate and is totally asymptomatic. Following discussion with Dr Ladona Ridgel of EP, they both agreed there is no indication for a pacemaker. Patient had no further recurrences, during her hospitalization. Avoiding AV-nodal blocking medication. Outpatient event monitor, 2D Echocardiogram and cardiology follow up, has been arranged.  3. Hypertension: Patient was quite clinically stable from this view point, pre-op. Post operatively, she had persistent hypotension overnight, with SBP in the 80s-low 90s, requiring NS boluses, but without chest pain or dyspnea. ARB and Amlodipine were placed on hold, with satisfactory clinical response. Defer to PMD for follow up and reinstatement of antihypertensives, as indicated.  4. Hyperlipidemia: Continued on Simvastatin.  5. History of TIA: Continued on Plavix.  6. Osteoporosis: Calcium supplements. Bisphosphonates will be added on discharge. .   Procedures:  See Below.   Left hip hemiarthroplasty 08/29/12.   Consultations:  Dr Durene Romans, orthopedic surgeon.   Discharge Exam: Filed Vitals:   08/31/12 0300 08/31/12 0600 08/31/12 0800 08/31/12 1200  BP: 130/45 122/64    Pulse:  86    Temp:  98 F (36.7 C)    TempSrc:  Oral    Resp:  18 20 20   Height:      Weight:      SpO2:  90% 93% 98%    General: Comfortable, alert, communicative, fully oriented, not short of breath at rest. Eating lunch.  HEENT: No clinical pallor, no jaundice, no conjunctival injection or discharge.  NECK: Supple, JVP not seen, no carotid bruits, no palpable lymphadenopathy, no  palpable goiter.  CHEST: Clinically clear to auscultation, no wheezes, no crackles.  HEART: Sounds 1 and 2 heard, normal, regular, no murmurs.  ABDOMEN: Full, soft, non-tender, no palpable organomegaly, no palpable masses, normal bowel sounds.   GENITALIA: Not examined.  LOWER EXTREMITIES: No pitting edema, palpable peripheral pulses.  MUSCULOSKELETAL SYSTEM: Clean dry dressings at surgical site, left hip/thigh. Otherwise, generalized osteoarthritic changes.  CENTRAL NERVOUS SYSTEM: No focal neurologic deficit on gross examination.  Discharge Instructions      Discharge Orders    Future Orders Please Complete By Expires   Diet - low sodium heart healthy      Diet - low sodium heart healthy      Call MD / Call 911      Comments:   If you experience chest pain or shortness of breath, CALL 911 and be transported to the hospital emergency room.  If you develope a fever above 101 F, pus (white drainage) or increased drainage or redness at the wound, or calf pain, call your surgeon's office.   Discharge instructions      Comments:   Maintain surgical dressing for 10-14 days, then replace with gauze and tape. Keep the area dry and clean until follow up. Follow up in 2 weeks at [facility]. Call with any questions or concerns.   Constipation Prevention      Comments:   Drink plenty of fluids.  Prune juice may be helpful.  You may use a stool softener, such as Colace (over the counter) 100 mg twice a day.  Use MiraLax (over the counter) for constipation as needed.   Increase activity slowly as tolerated      Increase activity slowly          Medication List     As of 08/31/2012 12:46 PM    STOP taking these medications         amLODipine 5 MG tablet   Commonly known as: NORVASC      losartan 100 MG tablet   Commonly known as: COZAAR      olmesartan 5 MG tablet   Commonly known as: BENICAR      TAKE these medications         acetaminophen 325 MG tablet   Commonly known as: TYLENOL   Take 2 tablets (650 mg total) by mouth every 6 (six) hours as needed for pain (or Fever >/= 101).      alendronate 70 MG tablet   Commonly known as: FOSAMAX   Take 1 tablet (70 mg total) by mouth every 7 (seven) days. Take  with a full glass of water on an empty stomach.      bisacodyl 10 MG suppository   Commonly known as: DULCOLAX   Place 1 suppository (10 mg total) rectally daily as needed.      calcium citrate 950 MG tablet   Commonly known as: CALCITRATE - dosed in mg elemental calcium   Take 1 tablet by mouth daily.      clopidogrel 75 MG tablet   Commonly known as: PLAVIX   Take 75 mg by mouth daily.      DSS 100 MG Caps   Take 100 mg by mouth 2 (two) times daily.      ferrous sulfate 325 (65 FE) MG tablet   Take 1 tablet (325 mg total) by mouth 3 (three) times daily after meals.      HYDROcodone-acetaminophen 5-325 MG per tablet   Commonly known as: NORCO/VICODIN  Take 1-2 tablets by mouth every 4 (four) hours as needed for pain.      ICAPS PO   Take by mouth.      polyethylene glycol packet   Commonly known as: MIRALAX / GLYCOLAX   Take 17 g by mouth daily as needed.      simvastatin 20 MG tablet   Commonly known as: ZOCOR   Take 20 mg by mouth every evening.      simvastatin 20 MG tablet   Commonly known as: ZOCOR   Take 1 tablet (20 mg total) by mouth every evening.         Follow-up Information    Follow up with Shelda Pal, MD.   Contact information:   [address] [place] [address] [phone]       Schedule an appointment as soon as possible for a visit with [name], MD.   Contact information:   [phone] [facility] [address] [place] [address] [phone]       Please follow up. (Follow up with SNF MD)       Follow up with Valera Castle, MD. Call in 4 weeks.   Contact information:   [address] [place] [address] [phone]           The results of significant diagnostics from this hospitalization (including imaging, microbiology, ancillary and laboratory) are listed below for reference.    Significant Diagnostic Studies: Dg Chest 1 View  08/28/2012  *RADIOLOGY  REPORT*  Clinical Data: Left hip pain status post fall today.  CHEST - 1 VIEW  Comparison: 11/16/2006 portable examination.  Findings: 1334 hours.  Positioning is lordotic.  Allowing for this, the heart size and mediastinal contours are stable.  Right perihilar fullness may be secondary to positioning.  The lungs are otherwise clear.  There is no pleural effusion.  Multiple surgical clips are present within the left upper quadrant of the abdomen. Patient is status post lower thoracic spinal augmentation.  There is a possible loose body within the left superior subscapularis recess.  IMPRESSION:  1.  No definite acute cardiopulmonary process. 2.  Altered appearance of the right hilum may be secondary to positioning.  Radiographic follow-up with attention to this area is recommended to exclude a developing nodule or infiltrate.   Original Report Authenticated By: Carey Bullocks, M.D.    Dg Hip Complete Left  08/28/2012  *RADIOLOGY REPORT*  Clinical Data: Fall  LEFT HIP - COMPLETE 2+ VIEW  Comparison: 10/13/2005  Findings: Mild osteopenia.  No acute fracture and no dislocation. Mild degenerative change of the SI and hip joints.  IMPRESSION: No acute bony pathology.   Original Report Authenticated By: Jolaine Click, M.D.    Dg Pelvis Portable  08/29/2012  *RADIOLOGY REPORT*  Clinical Data: Postop left hip replacement  PORTABLE PELVIS  Comparison: None.  Findings: Left hip arthroplasty in satisfactory position.  No fracture or dislocation is seen.  IMPRESSION: Left hip arthroplasty in satisfactory position.   Original Report Authenticated By: Charline Bills, M.D.    Ct Hip Left Wo Contrast  08/28/2012  *RADIOLOGY REPORT*  Clinical Data: Status post fall.  Left hip pain.  Question fracture.  CT OF THE LEFT HIP WITHOUT CONTRAST  Technique:  Multidetector CT imaging was performed according to the standard protocol. Multiplanar CT image reconstructions were also generated.  Comparison: Plain films earlier this  same date.  Findings: The patient has a  mildly impacted subcapital fracture of the left hip.  There is an associated small hip joint effusion.  No other fracture is identified.  No dislocation.  There is no acute or focal abnormality of the visualized intrapelvic contents.  IMPRESSION: Mildly impacted subcapital fracture left hip.   Original Report Authenticated By: [name], M.D.     Microbiology: Recent Results (from the past 240 hour(s))  URINE CULTURE     Status: Normal   Collection Time   08/28/12  2:53 PM      Component Value Range Status Comment   Specimen Description URINE, CATHETERIZED   Final    Special Requests NONE   Final    Culture  Setup Time 08/28/2012 22:45   Final    Colony Count NO GROWTH   Final    Culture NO GROWTH   Final    Report Status 08/30/2012 FINAL   Final   SURGICAL PCR SCREEN     Status: Normal   Collection Time   08/28/12 11:42 PM      Component Value Range Status Comment   MRSA, PCR NEGATIVE  NEGATIVE Final    Staphylococcus aureus NEGATIVE  NEGATIVE Final      Labs: Basic Metabolic Panel:  Lab 08/31/12 1027 08/30/12 0600 08/29/12 0520 08/28/12 1205  NA 132* 132* 135 136  K 3.2* 3.7 3.6 4.2  CL 100 97 99 98  CO2 24 21 23 22   GLUCOSE 124* 108* 104* 117*  BUN 14 17 9 14   CREATININE 0.50 0.73 0.43* 0.58  CALCIUM 8.2* 8.2* 8.8 9.5  MG -- -- -- --  PHOS -- -- -- --   Liver Function Tests: No results found for this basename: AST:5,ALT:5,ALKPHOS:5,BILITOT:5,PROT:5,ALBUMIN:5 in the last 168 hours No results found for this basename: LIPASE:5,AMYLASE:5 in the last 168 hours No results found for this basename: AMMONIA:5 in the last 168 hours CBC:  Lab 08/31/12 0520 08/30/12 0600 08/29/12 0520 08/28/12 1205  WBC 8.6 10.7* 9.4 8.9  NEUTROABS -- -- -- --  HGB 8.7* 9.7* 11.8* 12.7  HCT 25.0* 29.3* 35.5* 37.6  MCV 96.5 99.3 98.6 96.4  PLT 141* 153 166 234   Cardiac Enzymes:  Lab 08/28/12 1205  CKTOTAL --  CKMB --  CKMBINDEX --   TROPONINI <0.30   BNP: BNP (last 3 results) No results found for this basename: PROBNP:3 in the last 8760 hours CBG:  Lab 08/31/12 1157 08/31/12 0742 08/31/12 0351 08/30/12 2345 08/30/12 2146  GLUCAP 164* 131* 124* 145* 164*       Signed:  Judea Fennimore,CHRISTOPHER  [facility] 08/31/2012, 12:46 PM

## 2012-08-30 NOTE — Evaluation (Signed)
Physical Therapy Evaluation Patient Details Name: Christine Lyons MRN: [id number] DOB: [birth year] Today's Date: 08/30/2012 Time: 1210-1240 PT Time Calculation (min): 30 min  PT Assessment / Plan / Recommendation Clinical Impression  78 yo female s/p L hip hemiarthroplasty after sustaining fall at home. On eval, pt is +2 assist for all mobility. Limited evaluation due to significant pain. Recommend ST rehab at SNF to improve strength, activity tolerance, and gait.     PT Assessment  Patient needs continued PT services    Follow Up Recommendations  SNF    Does the patient have the potential to tolerate intense rehabilitation      Barriers to Discharge        Equipment Recommendations  Rolling walker with 5" wheels    Recommendations for Other Services OT consult   Frequency Min 3X/week    Precautions / Restrictions Precautions Precautions: Posterior Hip Restrictions Weight Bearing Restrictions: No LLE Weight Bearing: Weight bearing as tolerated   Pertinent Vitals/Pain L hip-unrated      Mobility  Bed Mobility Bed Mobility: Supine to Sit;Sit to Supine Supine to Sit: 1: +2 Total assist;HOB elevated Supine to Sit: Patient Percentage: 20% Sit to Supine: +2 Total assist : 10% Details for Bed Mobility Assistance: VCs safety, technique, hand placement. Assist for trunk to upright and bil LEs off bed. Utilized bed linen to assist with scoooting, positioning Transfers Transfers: Stand to Sit;Sit to Stand Sit to Stand: 1: +2 Total assist;From bed;From elevated surface;With upper extremity assist Sit to Stand: Patient Percentage: 40% Stand to Sit: 1: +2 Total assist;To bed;With upper extremity assist Stand to Sit: Patient Percentage: 40% Details for Transfer Assistance: x 2. Multimodal cues for safety, technique, hand placement. Assist to rise, stabilize, control descent. Pt able to stand ~10-15 seconds. Unable to attempt pivot due to pain.  Ambulation/Gait Ambulation/Gait  Assistance: Not tested (comment)    Shoulder Instructions     Exercises     PT Diagnosis: Difficulty walking;Abnormality of gait;Generalized weakness;Acute pain  PT Problem List: Decreased strength;Decreased range of motion;Decreased activity tolerance;Decreased mobility;Pain;Decreased knowledge of use of DME;Decreased knowledge of precautions PT Treatment Interventions: DME instruction;Gait training;Functional mobility training;Therapeutic activities;Therapeutic exercise;Patient/family education   PT Goals Acute Rehab PT Goals PT Goal Formulation: With patient/family Time For Goal Achievement: 09/13/12 Potential to Achieve Goals: Good Pt will go Supine/Side to Sit: with mod assist PT Goal: Supine/Side to Sit - Progress: Goal set today Pt will go Sit to Supine/Side: with mod assist PT Goal: Sit to Supine/Side - Progress: Goal set today Pt will go Sit to Stand: with mod assist PT Goal: Sit to Stand - Progress: Goal set today Pt will Transfer Bed to Chair/Chair to Bed: with mod assist PT Transfer Goal: Bed to Chair/Chair to Bed - Progress: Goal set today Pt will Ambulate: 16 - 50 feet;with mod assist;with rolling walker PT Goal: Ambulate - Progress: Goal set today  Visit Information  Last PT Received On: 08/30/12 Assistance Needed: +2    Subjective Data  Subjective: "I think its too early" Patient Stated Goal: Home   Prior Functioning  Home Living Lives With: Alone Additional Comments: Lives in independent at [place]; will go to SNF Prior Function Level of Independence: Independent Communication Communication: No difficulties Dominant Hand: Left    Cognition  Overall Cognitive Status: Appears within functional limits for tasks assessed/performed Arousal/Alertness: Awake/alert Orientation Level: Appears intact for tasks assessed Behavior During Session: [facility] for tasks performed    Extremity/Trunk Assessment Right Upper Extremity Assessment RUE ROM/Strength/Tone:  Within functional levels Left Upper Extremity Assessment LUE ROM/Strength/Tone: Within functional levels Right Lower Extremity Assessment RLE ROM/Strength/Tone: Deficits;Unable to fully assess;Due to pain RLE ROM/Strength/Tone Deficits: moves ankle well RLE Sensation: WFL - Light Touch Left Lower Extremity Assessment LLE ROM/Strength/Tone: WFL for tasks assessed Trunk Assessment Trunk Assessment: Normal   Balance    End of Session PT - End of Session Equipment Utilized During Treatment: Gait belt Activity Tolerance: Patient limited by pain;Patient limited by fatigue Patient left: in bed;with call bell/phone within reach;with family/visitor present Nurse Communication: Precautions  GP     Rebeca Alert [facility] 08/30/2012, 12:59 PM [phone]

## 2012-08-30 NOTE — Progress Notes (Signed)
Spoke with [facility].  [facility] able to receive Pt when she's medically stable.  CSW to continue to follow.  Providence Crosby, LCSWA Clinical Social Work [phone]

## 2012-08-30 NOTE — Evaluation (Signed)
Occupational Therapy Evaluation Patient Details Name: Christine Lyons MRN: [id number] DOB: July 09, 1925 Today's Date: 08/30/2012 Time: 4098-1191 OT Time Calculation (min): 36 min  OT Assessment / Plan / Recommendation Clinical Impression  This 78 year old female fell and sustained a L hip fx.  She is s/p hemiarthroplasty with posterior THPs and WBAT.  She will benefit from skilled OT to increase independence with adls and toilet transfers.     OT Assessment  Patient needs continued OT Services    Follow Up Recommendations  SNF    Barriers to Discharge      Equipment Recommendations  3 in 1 bedside comode    Recommendations for Other Services    Frequency  Min 2X/week    Precautions / Restrictions Precautions Precautions: Posterior Hip Restrictions Weight Bearing Restrictions: No LLE Weight Bearing: Weight bearing as tolerated   Pertinent Vitals/Pain Pt c/o pain with movement:  Not rated.  Repositioned and ice reapplied    ADL  Grooming: Simulated;Set up Where Assessed - Grooming: Unsupported sitting Upper Body Bathing: Simulated;Set up Where Assessed - Upper Body Bathing: Unsupported sitting Lower Body Bathing: Simulated;+2 Total assistance Lower Body Bathing: Patient Percentage: 20% Where Assessed - Lower Body Bathing: Supported sit to stand Upper Body Dressing: Simulated;Minimal assistance Where Assessed - Upper Body Dressing: Unsupported sitting Lower Body Dressing: +2 Total assistance Lower Body Dressing: Patient Percentage: 0% Where Assessed - Lower Body Dressing: Supported sit to stand Toileting - Architect and Hygiene: Simulated;+2 Total assistance Toileting - Architect and Hygiene: Patient Percentage: 0% Equipment Used: Rolling walker;Gait belt Transfers/Ambulation Related to ADLs: stood twice; unable to pivot to chair today ADL Comments: reviewed THPs and began education on AE but pt did not use today.      OT Diagnosis: Generalized  weakness  OT Problem List: Decreased strength;Decreased activity tolerance;Impaired balance (sitting and/or standing);Decreased knowledge of use of DME or AE;Decreased knowledge of precautions;Pain OT Treatment Interventions: Self-care/ADL training;DME and/or AE instruction;Patient/family education;Balance training;Therapeutic activities   OT Goals Acute Rehab OT Goals OT Goal Formulation: With patient/family Time For Goal Achievement: 09/06/12 Potential to Achieve Goals: Good ADL Goals Pt Will Transfer to Toilet: with 2+ total assist;Stand pivot transfer;3-in-1 (pt 60%) ADL Goal: Toilet Transfer - Progress: Goal set today Miscellaneous OT Goals Miscellaneous OT Goal #1: Pt will go from sit to stand for adls with total A x 2 pt 60% OT Goal: Miscellaneous Goal #1 - Progress: Goal set today Miscellaneous OT Goal #2: Pt will use AE for LB adls with supervision, min cues OT Goal: Miscellaneous Goal #2 - Progress: Goal set today Miscellaneous OT Goal #3: Pt will recall 3/3 thps OT Goal: Miscellaneous Goal #3 - Progress: Goal set today  Visit Information  Last OT Received On: 08/30/12 Assistance Needed: +2    Subjective Data  Subjective: I just think it's too soon for this (when standing) Patient Stated Goal: wants to get back to dog in I living asap   Prior Functioning     Home Living Lives With: Alone Additional Comments: Lives in independent at [place]; will go to SNF Prior Function Level of Independence: Independent Communication Communication: No difficulties Dominant Hand: Left         Vision/Perception     Cognition  Overall Cognitive Status: Appears within functional limits for tasks assessed/performed Arousal/Alertness: Awake/alert Orientation Level: Appears intact for tasks assessed Behavior During Session: [facility] for tasks performed    Extremity/Trunk Assessment Right Upper Extremity Assessment RUE ROM/Strength/Tone: Within functional levels Left Upper  Extremity Assessment LUE ROM/Strength/Tone: Within functional levels  Trunk Assessment Trunk Assessment: Normal     Mobility Bed Mobility Bed Mobility: Supine to Sit Supine to Sit: 1: +2 Total assist;HOB elevated Supine to Sit: Patient Percentage: 20% Details for Bed Mobility Assistance: VCs safety, technique, hand placement. Assist for trunk to upright and bil LEs off bed. Utilized bed linen to assist with scoooting, positioning Transfers Transfers: Sit to Stand Sit to Stand: 1: +2 Total assist;From bed;From elevated surface;With upper extremity assist Sit to Stand: Patient Percentage: 40% Stand to Sit: 1: +2 Total assist;To bed;With upper extremity assist Stand to Sit: Patient Percentage: 40% Details for Transfer Assistance: x 2. Multimodal cues for safety, technique, hand placement. Assist to rise, stabilize, control descent. Pt able to stand ~10-15 seconds. Unable to attempt pivot due to pain.      Shoulder Instructions     Exercise     Balance     End of Session OT - End of Session Activity Tolerance: Patient limited by pain;Patient limited by fatigue Patient left: in bed;with call bell/phone within reach;with family/visitor present  GO     Timtohy Broski 08/30/2012, 1:30 PM Marica Otter, OTR/L [phone] 08/30/2012

## 2012-08-30 NOTE — Progress Notes (Signed)
On-call NP notified of hypotension, BP noted of 98/59. Further decrease noted of 82/48, on-call NP notified. New order placed for 500 cc bolus. On-call notified of BP post bolus. Will monitor. Pt with no complaints.

## 2012-08-30 NOTE — Progress Notes (Signed)
Patient arrived to unit from PACU via bed. Patient arousable, able to voice name, date, place, and situation. Patient able to follow commands. Comfort measures initiated. Patient arrived back to the unit with ecchymosis to the inner left eye and bruising/dried blood to the left portion of the upper lip. Patient unable to explain new skin condition. PACU nurse unaware of how patient obtained new skin issues, stated she would ask other OR staff and phone unit. No other issues at this time. Dressing dry and intact to L hip. Will continue to monitor.

## 2012-08-30 NOTE — Progress Notes (Signed)
   Subjective: 1 Day Post-Op Procedure(s) (LRB): ARTHROPLASTY BIPOLAR HIP (Left)   Patient reports pain as mild, pain well controlled. No events throughout the night.  Objective:   VITALS:   Filed Vitals:   08/30/12 1000  BP: 91/51  Pulse: 63  Temp: 97.7 F (36.5 C)  Resp: 18    Neurovascular intact Dorsiflexion/Plantar flexion intact Incision: dressing C/D/I No cellulitis present Compartment soft  LABS  Basename 08/30/12 0600 08/29/12 0520 08/28/12 1205  HGB 9.7* 11.8* 12.7  HCT 29.3* 35.5* 37.6  WBC 10.7* 9.4 8.9  PLT 153 166 234     Basename 08/30/12 0600 08/29/12 0520 08/28/12 1205  NA 132* 135 136  K 3.7 3.6 4.2  BUN 17 9 14   CREATININE 0.73 0.43* 0.58  GLUCOSE 108* 104* 117*     Assessment/Plan: 1 Day Post-Op Procedure(s) (LRB): ARTHROPLASTY BIPOLAR HIP (Left) Up with therapy Discharge when medically ready Orthopaedically stable MiraLax and Colace for constipation Norco Rx on chart Resume Plavix Follow up in 2 weeks at [facility]. Follow up with OLIN,Chamara Dyck D in 2 weeks.  Contact information:  [facility] [address] [place] [address] [phone]        Christine Lyons. Christine Lyons   PAC  08/30/2012, 10:09 AM

## 2012-08-30 NOTE — Progress Notes (Signed)
   Subjective:  Denies CP or dyspnea; left hip sore   Objective:  Filed Vitals:   08/30/12 0400 08/30/12 0500 08/30/12 0515 08/30/12 0545  BP:  86/51 90/54 99/62   Pulse:  75  70  Temp:  98.3 F (36.8 C)    TempSrc:  Oral    Resp: 18 18    Height:      Weight:      SpO2: 94% 97%      Intake/Output from previous day:  Intake/Output Summary (Last 24 hours) at 08/30/12 1610 Last data filed at 08/29/12 2330  Gross per 24 hour  Intake    903 ml  Output    950 ml  Net    -47 ml    Physical Exam: Physical exam: Well-developed well-nourished in no acute distress.  Skin is warm and dry.  HEENT small laceration over lip Neck is supple.  Chest is clear to auscultation with normal expansion.  Cardiovascular exam is regular rate and rhythm.  Abdominal exam nontender or distended. No masses palpated. Extremities s/p repair left hip fracture neuro grossly intact    Lab Results: Basic Metabolic Panel:  Basename 08/29/12 0520 08/28/12 1205  NA 135 136  K 3.6 4.2  CL 99 98  CO2 23 22  GLUCOSE 104* 117*  BUN 9 14  CREATININE 0.43* 0.58  CALCIUM 8.8 9.5  MG -- --  PHOS -- --   CBC:  Basename 08/30/12 0600 08/29/12 0520  WBC 10.7* 9.4  NEUTROABS -- --  HGB 9.7* 11.8*  HCT 29.3* 35.5*  MCV 99.3 98.6  PLT 153 166   Cardiac Enzymes:  Basename 08/28/12 1205  CKTOTAL --  CKMB --  CKMBINDEX --  TROPONINI <0.30     Assessment/Plan:  1 Bradycardia - patient denies syncope but is not clear as to how she fell. Continue telemetry; I will review rhythm strips with EP. Best option may be outpatient monitor. Echo for LV function. 2 S/P repair hip fracture - management per orthopedics 3 Hypertension - BP is low today. Hold BP meds and follow. 4 Acute Blood loss anemia - follow Hgb 5 Hyperlipidemia - continue statin.  Olga Millers 08/30/2012, 6:32 AM

## 2012-08-30 NOTE — Progress Notes (Signed)
[facility] PROGRESS NOTE  ANESSIA OAKLAND WJX:[id number] DOB: [birth year] DOA: 08/28/2012 PCP: [name], MD  Assessment/Plan: Principal Problem:  *Hip fracture Active Problems:  HYPERLIPIDEMIA  HYPERTENSION  GERD  LOW BACK PAIN SYNDROME  TRANSIENT ISCHEMIC ATTACK, HX OF  Bradycardia    1. Mildly impacted subcapital fracture left hip:  This was secondary to a mechanical fall. Dr Durene Romans provided orthopedic consultation, and patient is s/p left hip hemiarthroplasty on 08/29/12. Now on POD# 1. Managing per ortho.  2. Bradycardia: Patient in AM of 08/29/12, had an episode of bradycardia with heart rate in the mid-20s. Telemetry revealed P-waves with nonconducting QRS complexes, concerning for possible Mobitz II. Dr Maisie Fus wall provided cardiology consultation, and has opined that although patient was very bradycardic, she was asleep at the time, has a narrow complex and a normal 12 lead EKG except for a borderline 1st degree AVB. When awake she has a normal heart rate and is totally asymptomatic. Following discussion with Dr Ladona Ridgel of EP, they both agreed there is no indication for a pacemaker. 3. Hypertension:  Patient was quite clinically stable from this view point, pre-op. Post operatively, she has had persistent hypotension with SBP in the 80s-low 90s, requiring NS boluses. ARB and Amlodipine on hold at this time.  4. Hyperlipidemia:  Continue simvastatin at discharge.  5. History of TIA:  Continue Plavix.  6. Osteoporosis: Continue calcium.   Code Status: Full Code.  Family Communication:  Disposition Plan: To be determined.    Brief narrative: 78 year old female with history of hypertension, dyslipidemia, s/p CVA/TIA, Diverticulosis, s/p laparoscopic gastric banding, who presents to [facility] long emergency department with left hip pain, after a fall at home. She reports she was trying to get to the bathroom, she fell off the toilet and landed on her left hip. A mildly  impacted subcapital fracture left hip noted on x-ray. Patient was admitted for further management.     Consultants:  Dr Durene Romans, orthopedic surgeon.  Dr Valera Castle, cardiologist.   Procedures:  X-Rays pelvis, and left hip.   CT scan left hip.   CXR.   S/P Left hip hemiarthroplasty 08/30/12.   Antibiotics:  Cefazolin.   HPI/Subjective: No new issues, this morning.   Objective: Vital signs in last 24 hours: Temp:  [97.7 F (36.5 C)-99.2 F (37.3 C)] 97.7 F (36.5 C) (12/18 1000) Pulse Rate:  [63-94] 63  (12/18 1000) Resp:  [9-20] 18  (12/18 1000) BP: (82-182)/(36-78) 91/51 mmHg (12/18 1000) SpO2:  [88 %-100 %] 95 % (12/18 1000) Weight change:  Last BM Date: 08/27/12  Intake/Output from previous day: 12/17 0701 - 12/18 0700 In: 903 [I.V.:903] Out: 950 [Urine:800; Blood:150]     Physical Exam: General: Comfortable, alert, communicative, fully oriented, not short of breath at rest. Eating lunch.  HEENT:  No clinical pallor, no jaundice, no conjunctival injection or discharge. NECK:  Supple, JVP not seen, no carotid bruits, no palpable lymphadenopathy, no palpable goiter. CHEST:  Clinically clear to auscultation, no wheezes, no crackles. HEART:  Sounds 1 and 2 heard, normal, regular, no murmurs. ABDOMEN:  Full, soft, non-tender, no palpable organomegaly, no palpable masses, normal bowel sounds. GENITALIA:  Not examined. LOWER EXTREMITIES:  No pitting edema, palpable peripheral pulses. MUSCULOSKELETAL SYSTEM:  Clean dry dressings at surgical site, left hip/thigh. Otherwise, generalized osteoarthritic changes. CENTRAL NERVOUS SYSTEM:  No focal neurologic deficit on gross examination.  Lab Results:  Basename 08/30/12 0600 08/29/12 0520  WBC 10.7* 9.4  HGB 9.7* 11.8*  HCT 29.3* 35.5*  PLT 153 166    Basename 08/30/12 0600 08/29/12 0520  NA 132* 135  K 3.7 3.6  CL 97 99  CO2 21 23  GLUCOSE 108* 104*  BUN 17 9  CREATININE 0.73 0.43*  CALCIUM 8.2*  8.8   Recent Results (from the past 240 hour(s))  URINE CULTURE     Status: Normal   Collection Time   08/28/12  2:53 PM      Component Value Range Status Comment   Specimen Description URINE, CATHETERIZED   Final    Special Requests NONE   Final    Culture  Setup Time 08/28/2012 22:45   Final    Colony Count NO GROWTH   Final    Culture NO GROWTH   Final    Report Status 08/30/2012 FINAL   Final   SURGICAL PCR SCREEN     Status: Normal   Collection Time   08/28/12 11:42 PM      Component Value Range Status Comment   MRSA, PCR NEGATIVE  NEGATIVE Final    Staphylococcus aureus NEGATIVE  NEGATIVE Final      Studies/Results: Dg Chest 1 View  08/28/2012  *RADIOLOGY REPORT*  Clinical Data: Left hip pain status post fall today.  CHEST - 1 VIEW  Comparison: 11/16/2006 portable examination.  Findings: 1334 hours.  Positioning is lordotic.  Allowing for this, the heart size and mediastinal contours are stable.  Right perihilar fullness may be secondary to positioning.  The lungs are otherwise clear.  There is no pleural effusion.  Multiple surgical clips are present within the left upper quadrant of the abdomen. Patient is status post lower thoracic spinal augmentation.  There is a possible loose body within the left superior subscapularis recess.  IMPRESSION:  1.  No definite acute cardiopulmonary process. 2.  Altered appearance of the right hilum may be secondary to positioning.  Radiographic follow-up with attention to this area is recommended to exclude a developing nodule or infiltrate.   Original Report Authenticated By: Carey Bullocks, M.D.    Dg Hip Complete Left  08/28/2012  *RADIOLOGY REPORT*  Clinical Data: Fall  LEFT HIP - COMPLETE 2+ VIEW  Comparison: 10/13/2005  Findings: Mild osteopenia.  No acute fracture and no dislocation. Mild degenerative change of the SI and hip joints.  IMPRESSION: No acute bony pathology.   Original Report Authenticated By: Jolaine Click, M.D.    Dg Pelvis  Portable  08/29/2012  *RADIOLOGY REPORT*  Clinical Data: Postop left hip replacement  PORTABLE PELVIS  Comparison: None.  Findings: Left hip arthroplasty in satisfactory position.  No fracture or dislocation is seen.  IMPRESSION: Left hip arthroplasty in satisfactory position.   Original Report Authenticated By: Charline Bills, M.D.    Ct Hip Left Wo Contrast  08/28/2012  *RADIOLOGY REPORT*  Clinical Data: Status post fall.  Left hip pain.  Question fracture.  CT OF THE LEFT HIP WITHOUT CONTRAST  Technique:  Multidetector CT imaging was performed according to the standard protocol. Multiplanar CT image reconstructions were also generated.  Comparison: Plain films earlier this same date.  Findings: The patient has a mildly impacted subcapital fracture of the left hip.  There is an associated small hip joint effusion.  No other fracture is identified.  No dislocation.  There is no acute or focal abnormality of the visualized intrapelvic contents.  IMPRESSION: Mildly impacted subcapital fracture left hip.   Original Report Authenticated By: [name], M.D.     Medications: Scheduled  Meds:   . calcium citrate  1 tablet Oral Daily  .  ceFAZolin (ANCEF) IV  2 g Intravenous Q6H  . clopidogrel  75 mg Oral Daily  . docusate sodium  100 mg Oral BID  . ferrous sulfate  325 mg Oral TID PC  . simvastatin  20 mg Oral q1800  . sodium chloride  3 mL Intravenous Q12H   Continuous Infusions:  PRN Meds:.acetaminophen, acetaminophen, bisacodyl, HYDROcodone-acetaminophen, HYDROmorphone (DILAUDID) injection, menthol-cetylpyridinium, metoCLOPramide (REGLAN) injection, metoCLOPramide, morphine injection, ondansetron, ondansetron (ZOFRAN) IV, ondansetron, phenol, polyethylene glycol, promethazine, sodium phosphate    LOS: 2 days   Jasmyn Picha,CHRISTOPHER  [facility] Pager [phone]. If 8PM-8AM, please contact night-coverage at [email or web address], password [facility] 08/30/2012, 11:31 AM  LOS: 2 days

## 2012-08-31 ENCOUNTER — Telehealth: Payer: Self-pay | Admitting: Cardiology

## 2012-08-31 ENCOUNTER — Encounter (HOSPITAL_COMMUNITY): Payer: Self-pay | Admitting: Orthopedic Surgery

## 2012-08-31 DIAGNOSIS — R001 Bradycardia, unspecified: Secondary | ICD-10-CM

## 2012-08-31 LAB — BASIC METABOLIC PANEL
BUN: 14 mg/dL (ref 6–23)
CO2: 24 mEq/L (ref 19–32)
Chloride: 100 mEq/L (ref 96–112)
Creatinine, Ser: 0.5 mg/dL (ref 0.50–1.10)
GFR calc Af Amer: >90 mL/min (ref >90)
Potassium: 3.2 mEq/L — ABNORMAL LOW (ref 3.5–5.1)

## 2012-08-31 LAB — CBC
HCT: 25 % — ABNORMAL LOW (ref 36.0–46.0)
MCV: 96.5 fL (ref 78.0–100.0)
RBC: 2.59 MIL/uL — ABNORMAL LOW (ref 3.87–5.11)
RDW: 13.6 % (ref 11.5–15.5)
WBC: 8.6 10*3/uL (ref 4.0–10.5)

## 2012-08-31 LAB — GLUCOSE, CAPILLARY
Glucose-Capillary: 124 mg/dL — ABNORMAL HIGH (ref 70–99)
Glucose-Capillary: 131 mg/dL — ABNORMAL HIGH (ref 70–99)

## 2012-08-31 MED ORDER — ACETAMINOPHEN 325 MG PO TABS: 650.0000 mg | ORAL_TABLET | Freq: Four times a day (QID) | ORAL | Status: DC | PRN

## 2012-08-31 MED ORDER — MENTHOL 3 MG MT LOZG: 1.0000 | LOZENGE | OROMUCOSAL | Status: DC | PRN

## 2012-08-31 MED ORDER — POTASSIUM CHLORIDE CRYS ER 20 MEQ PO TBCR
40.0000 meq | EXTENDED_RELEASE_TABLET | Freq: Once | ORAL | Status: AC
  Administered 2012-08-31: 40 meq via ORAL
  Filled 2012-08-31: qty 2

## 2012-08-31 MED ORDER — ALENDRONATE SODIUM 70 MG PO TABS: 70.0000 mg | ORAL_TABLET | ORAL | Status: DC

## 2012-08-31 MED ORDER — BISACODYL 10 MG RE SUPP: 10.0000 mg | Freq: Every day | RECTAL | Status: DC | PRN

## 2012-08-31 NOTE — Progress Notes (Signed)
Pt urine output is low normal, 400 mL in the past 12 hours.  I removed the foley.

## 2012-08-31 NOTE — Op Note (Signed)
NAMEJACK, MINEAU                ACCOUNT NO.:  [id number]  MEDICAL RECORD NO.:  [id number]  LOCATION:  1519                         FACILITY:  [facility]  PHYSICIAN:  Madlyn Frankel. [name], M.D.  DATE OF BIRTH:  February 21, 1925  DATE OF PROCEDURE:  08/29/2012 DATE OF DISCHARGE:                              OPERATIVE REPORT   PREOPERATIVE DIAGNOSIS:  Left hip femoral neck fracture.  POSTOPERATIVE DIAGNOSIS:  Left hip femoral neck fracture.  PROCEDURE:  Left hip hemiarthroplasty utilizing DePuy Tri-Lock size 4 standard stem with a 44 unipolar ball and a +0 adapter.  SURGEON:  Madlyn Frankel. [name], M.D.  ASSISTANT:  Surgical team.  ANESTHESIA:  General.  BLOOD LOSS:  Less than 150 mL.  DRAINS:  None.  COMPLICATION:  None apparent.  INDICATION FOR PROCEDURE:  Ms. Weidemann is an 78 year old female, mother of Adele Barthel who works at our office at [employer].  Ms. Cinnamon unfortunately had a ground level fall and presented to the emergency room.  Radiographs include a CT scan revealed a subcapital femoral neck fracture.  She was initially seen and evaluated by Dr. Beverely Low volunteered to help in assistant management of her care at [facility].  Dr. Ranell Patrick interviewed the family extensively and thus I had the same discussion of risks and benefits of percutaneous cannula screw fixation versus hemiarthroplasty.  She stated her husband had previously undergone a hemiarthroplasty and would much rather go down that road as opposed to watching hip fracture heal, the potential complications of not healing given the potential need for other surgery.  These all seemed to be risk that were less importantly her then going ahead and getting her hip fixed.  Given these findings today, we recommend hemiarthroplasty risks and benefits were discussed.  Consent was obtained.  PROCEDURE IN DETAIL:  The patient was brought to operative theater. Once adequate anesthesia, preoperative  antibiotics, Ancef administered, she was positioned into the right lateral decubitus position left side up.  The left lower extremity was then prepped and draped in sterile fashion.  Time-out was performed identifying the patient, planned procedure, and extremity.  Left hip incision was made in the proximal trochanter.  Sharp dissection was carried to the iliotibial band and gluteal fascia.  These were then incised for posterior approach.  The posterior aspect of the hip was exposed by taking down the short external rotators and posterior capsule, single capsulotomy.  The hip fracture was identified.  The hip was dislocated and neck osteotomy was made at the level of the trochanteric fossa.  I removed the femoral head and measured the hip to be a size 44 mm in diameter.  At this point, the proximal femur was opened.  The drill irrigated to try to prevent fat emboli.  An intramedullary rod was then passed.  We then broached up to a size 4 broach starting with a chili pepper broach setting anteversion about 20 degrees.  Trial reduction was carried out with a standard neck and a 0 adapter.  At this point, the hip was stable without evidence of impingement.  Her leg lengths appeared to be equal to the down leg.  Given these findings, I removed  the trial components, 4 standard steel stem was selected and then impacted to the level where the broach was.  Given these findings and the trial reduction a 0 adapter was impacted into the 44 ball and then 2 were impacted on a clean and dry trunnion.  The hip was irrigated throughout the case and again at this point I reapproximated posterior capsule to the superior leaflet using #1 Vicryl.  I then used a #1 Vicryl to reapproximate the iliotibial band and gluteal fascia.  The remainder of wound was closed with 2-0 Vicryl and running 4-0 Monocryl.  The hip was cleaned, dried, and dressed sterilely using a Aquacel dressing.  She was then awoken from  anesthesia and brought to the recovery room in stable condition tolerated the procedure well.     Madlyn Frankel [name], M.D.     MDO/MEDQ  D:  08/30/2012  T:  08/31/2012  Job:  [id number]

## 2012-08-31 NOTE — Progress Notes (Signed)
Patient discharged to SNF in stable condition.  Foley placed. Transported via PTAR.  Report called to nurse at facility.

## 2012-08-31 NOTE — Progress Notes (Signed)
MD notified of low BP's with widened pulse pressure. No new orders placed.  Pt is resting comfortably, asymptomatic. Dressing clean, dry, intact, full sensation. Will continue to monitor.

## 2012-08-31 NOTE — Telephone Encounter (Signed)
New problem:   Attaching discharge note from [facility] need an order to be placed into the system.    Bradycardia - I have reviewed rhythm strips with Dr Ladona Ridgel. Recommends outpatient monitor. Echo for LV function can also be performed as outpt

## 2012-08-31 NOTE — Progress Notes (Signed)
Foley d/c'd this am at approx. 0600am.  Pt has yet to void.  Bladder scan revealed 316cc.  MD paged and telephone order received for foley to be replace and discharge to SNF with foley.

## 2012-08-31 NOTE — Progress Notes (Signed)
Occupational Therapy Treatment Patient Details Name: Christine Lyons MRN: [id number] DOB: 1925/04/05 Today's Date: 08/31/2012 Time: 8295-6213 OT Time Calculation (min): 33 min  OT Assessment / Plan / Recommendation Comments on Treatment Session Pt continues to make improvements--pain limits her adl/mobility at this time    Follow Up Recommendations  SNF    Barriers to Discharge       Equipment Recommendations  3 in 1 bedside comode    Recommendations for Other Services    Frequency Min 2X/week   Plan      Precautions / Restrictions Precautions Precautions: Posterior Hip Precaution Comments: Pt educated on THP Restrictions Weight Bearing Restrictions: No LLE Weight Bearing: Weight bearing as tolerated   Pertinent Vitals/Pain L hip painful with movement--not rated; repositioned and ice applied.  Pt was premedicated    ADL  Lower Body Dressing: Performed;+2 Total assistance Lower Body Dressing: Patient Percentage: 30% Where Assessed - Lower Body Dressing: Supported sit to stand (with reacher and sock aid) Toilet Transfer: Simulated;+2 Total assistance Toilet Transfer: Patient Percentage: 50% Toilet Transfer Method: Sit to stand (using eva walker) Transfers/Ambulation Related to ADLs: pt took a few steps with eva walker and chair brought behind her. ADL Comments: reviewed thps; pt is carrying over applying them.  Introduced AE and pt practiced with sock aid and reacher on RLE--LLE too sore at this time.  Son in law came during session    OT Diagnosis:    OT Problem List:   OT Treatment Interventions:     OT Goals Acute Rehab OT Goals Time For Goal Achievement: 09/06/12 Miscellaneous OT Goals Miscellaneous OT Goal #1: Pt will go from sit to stand with mod A OT Goal: Miscellaneous Goal #1 - Progress: Updated due to goal met Miscellaneous OT Goal #2: Pt will use AE for LB adls with supervision, min cues OT Goal: Miscellaneous Goal #2 - Progress: Progressing toward  goals Miscellaneous OT Goal #3: Pt will recall 3/3 thps OT Goal: Miscellaneous Goal #3 - Progress: Progressing toward goals  Visit Information  Last OT Received On: 08/31/12 Assistance Needed: +2 PT/OT Co-Evaluation/Treatment: Yes    Subjective Data      Prior Functioning       Cognition  Overall Cognitive Status: Appears within functional limits for tasks assessed/performed Arousal/Alertness: Awake/alert Orientation Level: Appears intact for tasks assessed Behavior During Session: [facility] for tasks performed    Mobility  Shoulder Instructions Bed Mobility Bed Mobility: Supine to Sit;Sitting - Scoot to Edge of Bed Supine to Sit: 1: +2 Total assist Supine to Sit: Patient Percentage: 30% Sitting - Scoot to Edge of Bed: 1: +2 Total assist Sitting - Scoot to Edge of Bed: Patient Percentage: 10% Details for Bed Mobility Assistance: increased time and + 2 assist with use of bed pad to swival hips around to get pt to EOB. Transfers Sit to Stand: 1: +2 Total assist;From bed;From elevated surface Sit to Stand: Patient Percentage: 60% Stand to Sit: 1: +2 Total assist;To chair/3-in-1 Stand to Sit: Patient Percentage: 40% Details for Transfer Assistance: Mod VC's on proper hand placement and increased time due to pain level       Exercises      Balance     End of Session OT - End of Session Activity Tolerance: Patient limited by pain Patient left: in chair;with call bell/phone within reach (lift pad placed) Nurse Communication: Need for lift equipment;Mobility status;Precautions  GO     Advith Martine 08/31/2012, 1:32 PM Marica Otter, OTR/L [phone] 08/31/2012

## 2012-08-31 NOTE — Progress Notes (Signed)
   Subjective:  Denies CP or dyspnea; left hip sore   Objective:  Filed Vitals:   08/30/12 2000 08/30/12 2245 08/31/12 0250 08/31/12 0300  BP:  104/45 135/45 130/45  Pulse:  82    Temp:  98.5 F (36.9 C)    TempSrc:  Oral    Resp: 18 18    Height:      Weight:      SpO2: 98% 96%      Intake/Output from previous day:  Intake/Output Summary (Last 24 hours) at 08/31/12 5621 Last data filed at 08/31/12 0539  Gross per 24 hour  Intake   1860 ml  Output    600 ml  Net   1260 ml    Physical Exam: Physical exam: Well-developed well-nourished in no acute distress.  Skin is warm and dry.  HEENT small laceration over lip Neck is supple.  Chest is clear to auscultation with normal expansion.  Cardiovascular exam is regular rate and rhythm.  Abdominal exam nontender or distended. No masses palpated. Extremities s/p repair left hip fracture neuro grossly intact    Lab Results: Basic Metabolic Panel:  Basename 08/31/12 0520 08/30/12 0600  NA 132* 132*  K 3.2* 3.7  CL 100 97  CO2 24 21  GLUCOSE 124* 108*  BUN 14 17  CREATININE 0.50 0.73  CALCIUM 8.2* 8.2*  MG -- --  PHOS -- --   CBC:  Basename 08/31/12 0520 08/30/12 0600  WBC 8.6 10.7*  NEUTROABS -- --  HGB 8.7* 9.7*  HCT 25.0* 29.3*  MCV 96.5 99.3  PLT 141* 153   Cardiac Enzymes:  Basename 08/28/12 1205  CKTOTAL --  CKMB --  CKMBINDEX --  TROPONINI <0.30     Assessment/Plan:  1 Bradycardia -  I have reviewed  rhythm strips with Dr Ladona Ridgel. Recommends outpatient monitor. Echo for LV function can also be performed as outpt. 2 S/P repair hip fracture - management per orthopedics 3 Hypertension - BP improved; continue to hold antihypertensives. Follow BP at home and resume meds as needed; she should fu with her primary care for this issue. 4 Acute Blood loss anemia  5 Hyperlipidemia - continue statin. Patient can be DCed from a cardiac standpoint and fu with Dr [name] in 4-6 weeks Olga Millers 08/31/2012, 6:24 AM

## 2012-08-31 NOTE — Progress Notes (Signed)
Physical Therapy Treatment Patient Details Name: Christine Lyons MRN: [id number] DOB: Nov 04, 1924 Today's Date: 08/31/2012 Time: 3086-5784 PT Time Calculation (min): 25 min  PT Assessment / Plan / Recommendation Comments on Treatment Session  POD # 2 L Bipolar hip due to fall/fx.  Co TX with OT.  Assisted pt OOB to amb limited distance due to 8/10 L hip pain.  Pt plans to D/C to SNF.    Follow Up Recommendations  SNF     Does the patient have the potential to tolerate intense rehabilitation     Barriers to Discharge        Equipment Recommendations  None recommended by PT    Recommendations for Other Services    Frequency Min 3X/week   Plan Discharge plan remains appropriate    Precautions / Restrictions Precautions Precautions: Posterior Hip Precaution Comments: Pt educated on THP Restrictions Weight Bearing Restrictions: No LLE Weight Bearing: Weight bearing as tolerated   Pertinent Vitals/Pain C/o 9/10 L hip pain with act Applied ICE    Mobility  Bed Mobility Bed Mobility: Supine to Sit;Sitting - Scoot to Edge of Bed Supine to Sit: 1: +2 Total assist Supine to Sit: Patient Percentage: 30% Sitting - Scoot to Edge of Bed: 1: +2 Total assist Sitting - Scoot to Edge of Bed: Patient Percentage: 10% Details for Bed Mobility Assistance: increased time and + 2 assist with use of bed pad to swival hips around to get pt to EOB. Transfers Transfers: Sit to Stand;Stand to Sit Sit to Stand: 1: +2 Total assist;From bed;From elevated surface Sit to Stand: Patient Percentage: 60% Stand to Sit: 1: +2 Total assist;To chair/3-in-1 Stand to Sit: Patient Percentage: 40% Details for Transfer Assistance: Mod VC's on proper hand placement and increased time due to pain level Ambulation/Gait Ambulation/Gait Assistance: 1: +2 Total assist Ambulation/Gait: Patient Percentage: 50% Ambulation Distance (Feet): 4 Feet Assistive device: Eva walker Ambulation/Gait Assistance Details: Used EVA  walker for increased support and pain control.  Third person followed with recliner.  Pt required 100% VC's on proper sequencing and upright posture. Gait Pattern: Step-to pattern;Decreased stance time - left;Decreased weight shift to left;Shuffle Gait velocity: decreased     PT Goals                                           progressing    Visit Information  Last PT Received On: 08/31/12 Assistance Needed: +2    Subjective Data  Subjective: The pain is really bad Patient Stated Goal: home   Cognition  Overall Cognitive Status: Appears within functional limits for tasks assessed/performed Arousal/Alertness: Awake/alert Orientation Level: Appears intact for tasks assessed Behavior During Session: [facility] for tasks performed    Balance   poor  End of Session PT - End of Session Equipment Utilized During Treatment: Gait belt Activity Tolerance: Patient limited by fatigue;Patient limited by pain Patient left: in chair;with call bell/phone within reach (HOYER pad in recliner)   Felecia Shelling  PTA [facility]  Acute  Rehab Pager     [phone]

## 2012-08-31 NOTE — Telephone Encounter (Signed)
[facility] from [facility] [phone] called to make the appt for patient.

## 2012-08-31 NOTE — Progress Notes (Signed)
Patient ID: Christine Lyons, female   DOB: March 01, 1925, 78 y.o.   MRN: [id number] Subjective: 2 Days Post-Op Procedure(s) (LRB): ARTHROPLASTY BIPOLAR HIP (Left)    Patient reports pain as mild with regards to her hip but in general feels blah, some throat discomfort, general malaise, decrease motivation  Objective:   VITALS:   Filed Vitals:   08/31/12 0800  BP:   Pulse:   Temp:   Resp: 20    Neurovascular intact Incision: dressing C/D/I  LABS  Basename 08/31/12 0520 08/30/12 0600 08/29/12 0520  HGB 8.7* 9.7* 11.8*  HCT 25.0* 29.3* 35.5*  WBC 8.6 10.7* 9.4  PLT 141* 153 166     Basename 08/31/12 0520 08/30/12 0600 08/29/12 0520  NA 132* 132* 135  K 3.2* 3.7 3.6  BUN 14 17 9   CREATININE 0.50 0.73 0.43*  GLUCOSE 124* 108* 104*     Basename 08/28/12 1205  LABPT --  INR 1.02     Assessment/Plan: 2 Days Post-Op Procedure(s) (LRB): ARTHROPLASTY BIPOLAR HIP (Left)   Advance diet including Ensure shakes for protein in period of decrease appetite Up with therapy Discharge to SNF when medically cleared  Add chloraseptic lozenges to help with throat discomfort

## 2012-08-31 NOTE — Progress Notes (Signed)
Per MD, Pt ready for d/c.  Notified RN, Pt, family and facility.  Sent d/c summary.  Confirmed receipt of d/c summary.  Facility ready to receive Pt.  Arranged for transportation.  Amanda Eilene Voigt, LCSWA Clinical Social Work [phone]   

## 2012-09-07 ENCOUNTER — Telehealth: Payer: Self-pay | Admitting: *Deleted

## 2012-09-07 NOTE — Telephone Encounter (Signed)
Left message for Ms. Andaya to reschedule echo to a different day in order to get her echo and monitor done on the same day. Patient lives in [place].

## 2012-09-08 ENCOUNTER — Ambulatory Visit (HOSPITAL_COMMUNITY): Payer: Medicare Other | Attending: Internal Medicine

## 2012-09-08 DIAGNOSIS — I10 Essential (primary) hypertension: Secondary | ICD-10-CM | POA: Insufficient documentation

## 2012-09-08 DIAGNOSIS — R001 Bradycardia, unspecified: Secondary | ICD-10-CM

## 2012-09-08 DIAGNOSIS — E785 Hyperlipidemia, unspecified: Secondary | ICD-10-CM | POA: Insufficient documentation

## 2012-09-08 DIAGNOSIS — I495 Sick sinus syndrome: Secondary | ICD-10-CM | POA: Insufficient documentation

## 2012-09-08 NOTE — Progress Notes (Signed)
Echocardiogram performed.  

## 2012-09-11 NOTE — Progress Notes (Signed)
Patient ID: Christine Lyons, female   DOB: 11/24/1924, 78 y.o.   MRN: [id number] Late entry. Enrolled patient for event monitor on 12.24.13

## 2012-09-15 ENCOUNTER — Encounter: Payer: Self-pay | Admitting: Cardiology

## 2012-09-15 ENCOUNTER — Encounter (INDEPENDENT_AMBULATORY_CARE_PROVIDER_SITE_OTHER): Payer: Medicare Other

## 2012-09-15 DIAGNOSIS — I498 Other specified cardiac arrhythmias: Secondary | ICD-10-CM

## 2012-09-26 ENCOUNTER — Ambulatory Visit: Payer: Medicare Other | Admitting: Internal Medicine

## 2012-10-02 ENCOUNTER — Telehealth: Payer: Self-pay | Admitting: Physician Assistant

## 2012-10-02 NOTE — Telephone Encounter (Signed)
Pt's nurse at [facility] calling how long pt to wear heart monitor [phone],  fax results of echo to [facility] [phone]

## 2012-10-02 NOTE — Telephone Encounter (Signed)
Reports faxed  Where monitor 21 days event usually means 3-4 weeks

## 2012-10-09 ENCOUNTER — Encounter: Payer: Self-pay | Admitting: Physician Assistant

## 2012-10-09 ENCOUNTER — Ambulatory Visit (INDEPENDENT_AMBULATORY_CARE_PROVIDER_SITE_OTHER): Payer: Medicare Other | Admitting: Physician Assistant

## 2012-10-09 VITALS — BP 142/78 | HR 75 | Ht 61.0 in | Wt 124.0 lb

## 2012-10-09 DIAGNOSIS — I10 Essential (primary) hypertension: Secondary | ICD-10-CM

## 2012-10-09 DIAGNOSIS — E785 Hyperlipidemia, unspecified: Secondary | ICD-10-CM

## 2012-10-09 DIAGNOSIS — I498 Other specified cardiac arrhythmias: Secondary | ICD-10-CM

## 2012-10-09 NOTE — Patient Instructions (Addendum)
YOU HAVE A FOLLOW UP APPT WITH SCOTT WEAVER, [facility] ON 01/10/13 @ 11:30  NO CHANGES WERE MADE TODAY

## 2012-10-09 NOTE — Progress Notes (Signed)
[address] [place], [place]  [address] Phone: [phone], Fax:  [phone]  Date:  10/09/2012   ID:  Christine Lyons, DOB Jan 24, 1925, MRN [id number]  PCP:  [name], MD  Primary Cardiologist:  Dr. Valera Castle     History of Present Illness: Christine Lyons is a 79 y.o. female who returns for followup after a recent hospital admission.  She has a hx of HTN, HL, prior TIA. She was admitted 12/16-12/19 after a fall resulting in left hip fracture. She was seen by cardiology due to significant bradycardia with heart rates in the 20s. Patient was asleep at that time. Patient denies syncope or dizziness. Case was reviewed with Dr. Ladona Ridgel of EP. It was not felt that there was an indication for pacemaker at that time. Recommendation was for an outpatient echocardiogram and event monitor. She underwent left hip hemiarthroplasty. She stays at [facility].  OP Echo has demonstrated normal LVF.  She just finished her event monitor recently. She has no complaints today. She denies chest pain, shortness of breath, orthopnea, PND or pedal edema. She seems to be doing well with physical therapy rehabilitating her hip. She denies dizziness, lightheadedness, syncope, near syncope.  Labs (12/13):  K 3.2, creatinine 0.50, Hgb 8.7, TSH 2.356  Wt Readings from Last 3 Encounters:  10/09/12 124 lb (56.246 kg)  08/28/12 142 lb 13.7 oz (64.8 kg)  08/28/12 142 lb 13.7 oz (64.8 kg)     Past Medical History  Diagnosis Date  . Hyperlipidemia   . Hypertension   . Stroke 1999    TIA in [place]  . Esophageal reflux   . Personal history of other malignant neoplasm of skin   . Osteopenia   . Diverticulitis   . GERD (gastroesophageal reflux disease)   . Unspecified hearing loss   . Spinal stenosis   . Hip fracture     left, Dr. [name]  . Bradycardia   . Hx of echocardiogram     a. Echo 09/08/12: EF 65%, grade 1 diastolic dysfunction.    Current Outpatient  Prescriptions  Medication Sig Dispense Refill  . acetaminophen (TYLENOL) 325 MG tablet Take 2 tablets (650 mg total) by mouth every 6 (six) hours as needed for pain (or Fever >/= 101).  60 tablet  0  . alum & mag hydroxide-simeth (MAALOX PLUS) 400-400-40 MG/5ML suspension Take 30 mLs by mouth every 6 (six) hours as needed.      . bisacodyl (DULCOLAX) 10 MG suppository Place 1 suppository (10 mg total) rectally daily as needed.  12 suppository  0  . calcium citrate (CALCITRATE - DOSED IN MG ELEMENTAL CALCIUM) 950 MG tablet Take 1 tablet by mouth daily.      . clopidogrel (PLAVIX) 75 MG tablet Take 75 mg by mouth daily.      Marland Kitchen docusate sodium (COLACE) 100 MG capsule Take 100 mg by mouth 2 (two) times daily.      . ferrous sulfate 325 (65 FE) MG tablet Take 1 tablet (325 mg total) by mouth 3 (three) times daily after meals.      . Multiple Vitamins-Minerals (ICAPS PO) Take by mouth.      . OxyCODONE HCl, Abuse Deter, 5 MG TABA Take 5 mg by mouth 4 (four) times daily as needed.      . pantoprazole (PROTONIX) 40 MG tablet Take 40 mg by mouth daily.      . polyethylene glycol (MIRALAX / GLYCOLAX) packet Take  17 g by mouth daily as needed.  14 each    . senna (SENOKOT) 8.6 MG TABS Take 2 tablets by mouth daily.      . simvastatin (ZOCOR) 20 MG tablet Take 20 mg by mouth every evening.      . simvastatin (ZOCOR) 20 MG tablet Take 1 tablet (20 mg total) by mouth every evening.  90 tablet  2    Allergies:    Allergies  Allergen Reactions  . Codeine     nausea    Social History:  The patient  reports that she has never smoked. She has never used smokeless tobacco. She reports that she drinks about 2.5 ounces of alcohol per week. She reports that she does not use illicit drugs.   ROS:  Please see the history of present illness.      All other systems reviewed and negative.   PHYSICAL EXAM: VS:  BP 142/78  Pulse 75  Ht 5\' 1"  (1.549 m)  Wt 124 lb (56.246 kg)  BMI 23.43 kg/m2 Well nourished, well  developed, in no acute distress HEENT: normal Neck: no JVD at 90 Cardiac:  normal S1, S2; RRR; no murmur Lungs:  clear to auscultation bilaterally, no wheezing, rhonchi or rales Abd: soft, nontender, no hepatomegaly Ext: no edema Skin: warm and dry Neuro:  CNs 2-12 intact, no focal abnormalities noted  EKG:  NSR, HR 75, normal axis, no acute changes     ASSESSMENT AND PLAN:  1. Bradycardia:  I reviewed the available strips from her event monitor. Final report is pending. Thus far, no significant brady-arrhythmias or pauses have been documented. Echocardiogram demonstrated normal LV function. No further workup indicated at this time. We reviewed signs and symptoms of symptomatic bradycardia.  2. Hypertension:  Controlled. 3. Hyperlipidemia:  She remains on statin therapy. 4. Disposition:  Followup with me in 3 months.  Luna Glasgow, PA-C  12:30 PM 10/09/2012

## 2012-10-18 ENCOUNTER — Other Ambulatory Visit: Payer: Self-pay | Admitting: *Deleted

## 2012-10-18 DIAGNOSIS — R001 Bradycardia, unspecified: Secondary | ICD-10-CM

## 2012-12-04 ENCOUNTER — Other Ambulatory Visit: Payer: Self-pay | Admitting: Internal Medicine

## 2012-12-04 NOTE — Telephone Encounter (Signed)
Schedule CPX 

## 2013-01-10 ENCOUNTER — Ambulatory Visit: Payer: Medicare Other | Admitting: Physician Assistant

## 2013-03-17 ENCOUNTER — Other Ambulatory Visit: Payer: Self-pay | Admitting: Internal Medicine

## 2013-03-19 NOTE — Telephone Encounter (Signed)
[facility] please advise on refill request for Plavix, last OV 11/2011, no pending appointment.  Patient seen Cardiology 09/2012

## 2013-03-19 NOTE — Telephone Encounter (Signed)
Please verify the prescribing physician with pharmacy. If it is I , give #[phone] & schedule appt

## 2013-05-17 ENCOUNTER — Other Ambulatory Visit: Payer: Self-pay | Admitting: *Deleted

## 2013-05-17 DIAGNOSIS — G459 Transient cerebral ischemic attack, unspecified: Secondary | ICD-10-CM

## 2013-05-17 MED ORDER — CLOPIDOGREL BISULFATE 75 MG PO TABS: 75.0000 mg | ORAL_TABLET | Freq: Every day | ORAL | Status: DC

## 2013-05-17 NOTE — Telephone Encounter (Signed)
Rf for plavix sent to [facility]

## 2014-04-26 ENCOUNTER — Encounter: Payer: Self-pay | Admitting: Internal Medicine

## 2014-04-26 DIAGNOSIS — C44311 Basal cell carcinoma of skin of nose: Secondary | ICD-10-CM | POA: Insufficient documentation

## 2014-05-06 ENCOUNTER — Encounter: Payer: Self-pay | Admitting: Internal Medicine

## 2015-12-23 ENCOUNTER — Encounter (HOSPITAL_BASED_OUTPATIENT_CLINIC_OR_DEPARTMENT_OTHER): Payer: Self-pay | Admitting: *Deleted

## 2015-12-23 ENCOUNTER — Emergency Department (HOSPITAL_BASED_OUTPATIENT_CLINIC_OR_DEPARTMENT_OTHER): Payer: Medicare Other

## 2015-12-23 ENCOUNTER — Emergency Department (HOSPITAL_BASED_OUTPATIENT_CLINIC_OR_DEPARTMENT_OTHER)
Admission: EM | Admit: 2015-12-23 | Discharge: 2015-12-23 | Disposition: A | Discharge status: Home / Self Care | Payer: Medicare Other | Attending: Emergency Medicine | Admitting: Emergency Medicine

## 2015-12-23 DIAGNOSIS — M549 Dorsalgia, unspecified: Secondary | ICD-10-CM

## 2015-12-23 DIAGNOSIS — S3992XA Unspecified injury of lower back, initial encounter: Secondary | ICD-10-CM | POA: Diagnosis present

## 2015-12-23 DIAGNOSIS — Z8739 Personal history of other diseases of the musculoskeletal system and connective tissue: Secondary | ICD-10-CM | POA: Insufficient documentation

## 2015-12-23 DIAGNOSIS — Z79899 Other long term (current) drug therapy: Secondary | ICD-10-CM | POA: Diagnosis not present

## 2015-12-23 DIAGNOSIS — I10 Essential (primary) hypertension: Secondary | ICD-10-CM | POA: Insufficient documentation

## 2015-12-23 DIAGNOSIS — Z8781 Personal history of (healed) traumatic fracture: Secondary | ICD-10-CM | POA: Insufficient documentation

## 2015-12-23 DIAGNOSIS — Y998 Other external cause status: Secondary | ICD-10-CM | POA: Diagnosis not present

## 2015-12-23 DIAGNOSIS — Z85828 Personal history of other malignant neoplasm of skin: Secondary | ICD-10-CM | POA: Insufficient documentation

## 2015-12-23 DIAGNOSIS — S8992XA Unspecified injury of left lower leg, initial encounter: Secondary | ICD-10-CM | POA: Insufficient documentation

## 2015-12-23 DIAGNOSIS — W1839XA Other fall on same level, initial encounter: Secondary | ICD-10-CM | POA: Insufficient documentation

## 2015-12-23 DIAGNOSIS — Y9389 Activity, other specified: Secondary | ICD-10-CM | POA: Insufficient documentation

## 2015-12-23 DIAGNOSIS — Z8673 Personal history of transient ischemic attack (TIA), and cerebral infarction without residual deficits: Secondary | ICD-10-CM | POA: Diagnosis not present

## 2015-12-23 DIAGNOSIS — K219 Gastro-esophageal reflux disease without esophagitis: Secondary | ICD-10-CM | POA: Insufficient documentation

## 2015-12-23 DIAGNOSIS — H9190 Unspecified hearing loss, unspecified ear: Secondary | ICD-10-CM | POA: Insufficient documentation

## 2015-12-23 DIAGNOSIS — W19XXXA Unspecified fall, initial encounter: Secondary | ICD-10-CM

## 2015-12-23 DIAGNOSIS — Z7902 Long term (current) use of antithrombotics/antiplatelets: Secondary | ICD-10-CM | POA: Insufficient documentation

## 2015-12-23 DIAGNOSIS — Y9289 Other specified places as the place of occurrence of the external cause: Secondary | ICD-10-CM | POA: Diagnosis not present

## 2015-12-23 DIAGNOSIS — E785 Hyperlipidemia, unspecified: Secondary | ICD-10-CM | POA: Insufficient documentation

## 2015-12-23 MED ORDER — TRAMADOL HCL 50 MG PO TABS: 50.0000 mg | ORAL_TABLET | Freq: Four times a day (QID) | ORAL | Status: DC | PRN

## 2015-12-23 MED FILL — traMADol HCL 50 MG TABS: 50 | 5 days supply | Qty: 20 | Fill #0

## 2015-12-23 NOTE — ED Notes (Signed)
Pt returns from xray via w/c, smiling in nad.

## 2015-12-23 NOTE — ED Notes (Signed)
She fell forward a week ago and has had lower back pain since. She has an appointment with her MD tomorrow but felt she should go ahead and get an xray. Pain radiates down her left leg when she walks.

## 2015-12-23 NOTE — ED Notes (Signed)
Patient transported to X-ray 

## 2015-12-23 NOTE — ED Provider Notes (Signed)
CSN: [id number]     Arrival date & time 12/23/15  1208 History   First MD Initiated Contact with Patient 12/23/15 1219     Chief Complaint  Patient presents with  . Back Pain     (Consider location/radiation/quality/duration/timing/severity/associated sxs/prior Treatment) Patient is a 82 y.o. female presenting with back pain. The history is provided by the patient and medical records.  Back Pain  82 year old female with history of hyperlipidemia, hypertension, stroke, GERD, spinal stenosis, presenting to the ED for back pain. Patient states last week she was on the phone with a friend, went to hang up the phone and turned to her left but lost her footing and fell onto a chair. She states it with a cushioned chair with wooden arm rails. She denies any head injury or loss of consciousness. She states she felt a little sore initially, but is concerned that pain has not subsided. She reports pain is localized mostly to her left lower back with radiation into her left hip. She denies any numbness or weakness of her legs. No bowel or bladder incontinence. She states pain is worse with movement and when attempting to walk. Patient ambulates with cane at baseline. Patient currently lives at [facility] facility, however MD was not present today to evaluate so nurse encouraged family to bring her here for x-rays.  Patient does have history of left THA.  No other back or hip surgeries.  States she has been taking tylenol which helped at first but does not seem to be controlling her pain anymore.  VSS.  Past Medical History  Diagnosis Date  . Hyperlipidemia   . Hypertension   . Stroke [facility]) 1999    TIA in [place]  . Esophageal reflux   . Personal history of other malignant neoplasm of skin   . Osteopenia   . Diverticulitis   . GERD (gastroesophageal reflux disease)   . Unspecified hearing loss   . Spinal stenosis   . Hip fracture (HCC)     left, Dr. Alvan Dame  . Bradycardia   . Hx of echocardiogram     a.  Echo 09/08/12: EF 123456, grade 1 diastolic dysfunction.   Past Surgical History  Procedure Laterality Date  . Appendectomy    . Cholecystectomy    . Abdominal hysterectomy      with BSO for dysfunctional menses  . Tonsillectomy and adenoidectomy    . Laparoscopic gastric banding      to prevent GERD  . Colonscopy      diverticulosis  . G 7 p 6    . Hip arthroplasty  08/29/2012    Procedure: ARTHROPLASTY BIPOLAR HIP;  Surgeon: Mauri Pole, MD;  Location: [facility];  Service: Orthopedics;  Laterality: Left;  LEFT HIP HEMIARTHROPLASTY   Family History  Problem Relation Age of Onset  . Hypertension Mother   . Heart disease Brother     died of an MI, late 15s  . Dementia Father    Social History  Substance Use Topics  . Smoking status: Never Smoker   . Smokeless tobacco: Never Used  . Alcohol Use: 2.5 oz/week    5 drink(s) per week     Comment: Glass of wine almost every day   OB History    No data available     Review of Systems  Musculoskeletal: Positive for back pain and arthralgias.  All other systems reviewed and are negative.     Allergies  Codeine  Home Medications   Prior to  Admission medications   Medication Sig Start Date End Date Taking? Authorizing Provider  acetaminophen (TYLENOL) 325 MG tablet Take 2 tablets (650 mg total) by mouth every 6 (six) hours as needed for pain (or Fever >/= 101). 08/31/12   Monika Salk, MD  alum & mag hydroxide-simeth (MAALOX PLUS) 400-400-40 MG/5ML suspension Take 30 mLs by mouth every 6 (six) hours as needed.    Historical Provider, MD  bisacodyl (DULCOLAX) 10 MG suppository Place 1 suppository (10 mg total) rectally daily as needed. 08/31/12   Monika Salk, MD  calcium citrate (CALCITRATE - DOSED IN MG ELEMENTAL CALCIUM) 950 MG tablet Take 1 tablet by mouth daily.    Historical Provider, MD  clopidogrel (PLAVIX) 75 MG tablet Take 1 tablet (75 mg total) by mouth daily. 05/17/13   Hendricks Limes, MD  docusate sodium (COLACE) 100  MG capsule Take 100 mg by mouth 2 (two) times daily.    Historical Provider, MD  ferrous sulfate 325 (65 FE) MG tablet Take 1 tablet (325 mg total) by mouth 3 (three) times daily after meals. 08/30/12   Danae Orleans, PA-C  losartan (COZAAR) 100 MG tablet APPOINTMENT DUE, 1 by mouth daily 12/04/12   Hendricks Limes, MD  Multiple Vitamins-Minerals (ICAPS PO) Take by mouth.    Historical Provider, MD  OxyCODONE HCl, Abuse Deter, 5 MG TABA Take 5 mg by mouth 4 (four) times daily as needed.    Historical Provider, MD  pantoprazole (PROTONIX) 40 MG tablet Take 40 mg by mouth daily.    Historical Provider, MD  polyethylene glycol (MIRALAX / GLYCOLAX) packet Take 17 g by mouth daily as needed. 08/30/12   Danae Orleans, PA-C  senna (SENOKOT) 8.6 MG TABS Take 2 tablets by mouth daily.    Historical Provider, MD  simvastatin (ZOCOR) 20 MG tablet Take 1 tablet (20 mg total) by mouth every evening. 01/06/11 01/06/12  Hendricks Limes, MD  simvastatin (ZOCOR) 20 MG tablet Take 20 mg by mouth every evening.    Historical Provider, MD  simvastatin (ZOCOR) 20 MG tablet APPOINTMENT DUE, 1 by mouth daily 12/04/12   Hendricks Limes, MD   BP 156/81 mmHg  Pulse 65  Temp(Src) 97.7 F (36.5 C) (Oral)  Resp 18  Ht 5\' 1"  (1.549 m)  Wt 56.246 kg  BMI 23.44 kg/m2  SpO2 96%   Physical Exam  Constitutional: She is oriented to person, place, and time. She appears well-developed and well-nourished.  HENT:  Head: Normocephalic and atraumatic.  Mouth/Throat: Oropharynx is clear and moist.  Eyes: Conjunctivae and EOM are normal. Pupils are equal, round, and reactive to light.  Neck: Normal range of motion.  Cardiovascular: Normal rate, regular rhythm and normal heart sounds.   Pulmonary/Chest: Effort normal and breath sounds normal.  Abdominal: Soft. Bowel sounds are normal.  Musculoskeletal: Normal range of motion.  Tenderness of left SI joint without acute deformity; midline non-tender; pain reproduced with flexion  forward or movement of left leg; DP pulses intact bilaterally; normal strength and sensation of BLE  Neurological: She is alert and oriented to person, place, and time.  AAOx3, answering questions and following commands appropriately; equal strength UE and LE bilaterally; CN grossly intact; moves all extremities appropriately without ataxia; no focal neuro deficits or facial asymmetry appreciated  Skin: Skin is warm and dry.  Psychiatric: She has a normal mood and affect.  Nursing note and vitals reviewed.   ED Course  Procedures (including critical care time) Labs Review  Labs Reviewed - No data to display  Imaging Review Dg Lumbar Spine Complete  12/23/2015  CLINICAL DATA:  Low back and LEFT hip pain, fell last week, prior LEFT hip replacement EXAM: LUMBAR SPINE - COMPLETE 4+ VIEW COMPARISON:  None FINDINGS: Osseous demineralization. Transitional vertebra at the lumbosacral junction. Prior spinal augmentation procedure at the last rib-bearing vertebra at site of a prior compression fracture. Dextro convex scoliosis with multilevel disc space narrowing and endplate spur formation. Facet degenerative changes throughout the mid inferior lumbar spine. Vertebral body heights otherwise maintained. No acute fracture, subluxation, or bone destruction. No spondylolysis. SI joints symmetric. LEFT hip prosthesis noted. IMPRESSION: Degenerative disc and facet disease changes lumbar spine with dextro convex scoliosis and osseous demineralization. No acute abnormalities. Electronically Signed   By: Lavonia Dana M.D.   On: 12/23/2015 13:45   Dg Hip Unilat With Pelvis 2-3 Views Left  12/23/2015  CLINICAL DATA:  LEFT hip pain from falling last week, prior hip replacement, low back pain as well EXAM: DG HIP (WITH OR WITHOUT PELVIS) 2-3V LEFT COMPARISON:  None FINDINGS: Diffuse osseous demineralization. LEFT hip prosthesis identified. SI joints symmetric. RIGHT hip joint space preserved. No acute fracture,  dislocation or bone destruction. No periprosthetic lucency. Degenerative disc and facet disease changes at visualized lower lumbar spine. IMPRESSION: Osseous demineralization with postsurgical changes of LEFT hip arthroplasty. No acute abnormalities. Electronically Signed   By: Lavonia Dana M.D.   On: 12/23/2015 13:43   I have personally reviewed and evaluated these images and lab results as part of my medical decision-making.   EKG Interpretation None      MDM   Final diagnoses:  Fall, initial encounter  Back pain, unspecified location   82 year old female here for low back pain after a fall last week. No head injury loss of consciousness. Pain localized to left SI joint without noted deformity on exam. Pain is reproducible with flexion forward and movement of the left leg. No leg numbness/weakness or bowel/bladder incontinence.  Presentation appears clinically consistent with sciatica, however given recent fall and patient age with history of left THA, will obtain films of lumbar spine and left hip.  X-rays with degenerative changes, no acute fractures or dislocation. Patient remains without any signs or symptoms concerning for cauda equina. She is ambulatory with assistance of cane which is baseline.  Results were discussed with patient and daughter at bedside, they  aclknowledged understanding. She has previously arranged follow-up with PCP tomorrow, copies of x-rays given for physician review.  Patient has allergy to codeine (states makes her foggy and not able to function plus N/V), will start on tramadol.  Discussed plan with patient, he/she acknowledged understanding and agreed with plan of care.  Return precautions given for new or worsening symptoms.  Larene Pickett, PA-C 12/23/15 1438  Harvel Quale, MD 12/24/15 [phone]

## 2015-12-23 NOTE — Discharge Instructions (Signed)
As we discussed, your x-rays today were negative for any acute fractures/injuries. Take the prescribed medication as directed.  You may continue taking tylenol with this if desired.  Do not take more than 2g (2000 mg) of tylenol daily. Follow-up with your doctor as scheduled tomorrow.  I have stapled copies of x-rays on back for his review. Return to the ED for new or worsening symptoms.

## 2015-12-24 DIAGNOSIS — W19XXXA Unspecified fall, initial encounter: Secondary | ICD-10-CM | POA: Insufficient documentation

## 2015-12-24 DIAGNOSIS — Y92009 Unspecified place in unspecified non-institutional (private) residence as the place of occurrence of the external cause: Secondary | ICD-10-CM | POA: Insufficient documentation

## 2015-12-24 DIAGNOSIS — Z9181 History of falling: Secondary | ICD-10-CM | POA: Insufficient documentation

## 2016-01-27 ENCOUNTER — Other Ambulatory Visit: Payer: Self-pay | Admitting: Sports Medicine

## 2016-01-27 DIAGNOSIS — M545 Low back pain, unspecified: Secondary | ICD-10-CM

## 2016-01-29 ENCOUNTER — Ambulatory Visit
Admission: RE | Admit: 2016-01-29 | Discharge: 2016-01-29 | Disposition: A | Discharge status: Home / Self Care | Payer: Medicare Other | Source: Ambulatory Visit | Attending: Sports Medicine | Admitting: Sports Medicine

## 2016-01-29 ENCOUNTER — Other Ambulatory Visit: Payer: Self-pay | Admitting: Sports Medicine

## 2016-01-29 DIAGNOSIS — M545 Low back pain, unspecified: Secondary | ICD-10-CM

## 2016-02-06 ENCOUNTER — Other Ambulatory Visit: Payer: Self-pay | Admitting: Sports Medicine

## 2016-02-06 ENCOUNTER — Ambulatory Visit
Admission: RE | Admit: 2016-02-06 | Discharge: 2016-02-06 | Disposition: A | Discharge status: Home / Self Care | Payer: Medicare Other | Source: Ambulatory Visit | Attending: Sports Medicine | Admitting: Sports Medicine

## 2016-02-06 VITALS — BP 145/58 | HR 66 | Temp 97.8°F | Resp 18

## 2016-02-06 DIAGNOSIS — M545 Low back pain, unspecified: Secondary | ICD-10-CM

## 2016-02-06 MED ORDER — CEFAZOLIN SODIUM-DEXTROSE 2-4 GM/100ML-% IV SOLN
2.0000 g | Freq: Once | INTRAVENOUS | Status: AC
  Administered 2016-02-06: 2 g via INTRAVENOUS

## 2016-02-06 MED ORDER — MIDAZOLAM HCL 2 MG/2ML IJ SOLN
1.0000 mg | INTRAMUSCULAR | Status: DC | PRN
Start: 2016-02-06 — End: 2016-02-07
  Administered 2016-02-06 (×5): 0.5 mg via INTRAVENOUS

## 2016-02-06 MED ORDER — KETOROLAC TROMETHAMINE 30 MG/ML IJ SOLN
30.0000 mg | Freq: Once | INTRAMUSCULAR | Status: AC
  Administered 2016-02-06: 30 mg via INTRAVENOUS

## 2016-02-06 MED ORDER — FENTANYL CITRATE (PF) 100 MCG/2ML IJ SOLN
25.0000 ug | INTRAMUSCULAR | Status: DC | PRN
  Administered 2016-02-06 (×5): 25 ug via INTRAVENOUS

## 2016-02-06 MED ORDER — SODIUM CHLORIDE 0.9 % IV SOLN
Freq: Once | INTRAVENOUS | Status: AC
  Administered 2016-02-06: 08:00:00 via INTRAVENOUS

## 2016-02-06 NOTE — Discharge Instructions (Signed)
Disc Aspiration Post Procedure Discharge Instructions  1. May resume a regular diet and any medications that you routinely take (including pain medications). 2. No driving day of procedure. 3. Upon discharge go home and rest for at least 4 hours.  May use an ice pack as needed to injection sites on back. 4. Remove dressing tomorrow after your shower. Replace with bandades daily until healed. 5. Follow up with your primary physician [id number] for bone therapy. 6. Do not lift anything heavier than a milk jug. 7. Increase activity slowly, walking is the best exercise.    Please contact our office at [phone] for the following symptoms:   Fever greater than 100 degrees  Increased swelling, pain, or redness at injection site.   Thank you for visiting [facility].

## 2016-09-22 ENCOUNTER — Encounter (HOSPITAL_COMMUNITY): Payer: Self-pay

## 2016-09-22 ENCOUNTER — Observation Stay (HOSPITAL_COMMUNITY)
Admission: EM | Admit: 2016-09-22 | Discharge: 2016-09-23 | Disposition: A | Discharge status: Skilled Nursing Facility | Payer: Medicare Other | Attending: Family Medicine | Admitting: Family Medicine

## 2016-09-22 ENCOUNTER — Emergency Department (HOSPITAL_COMMUNITY): Payer: Medicare Other

## 2016-09-22 DIAGNOSIS — Z9884 Bariatric surgery status: Secondary | ICD-10-CM | POA: Diagnosis not present

## 2016-09-22 DIAGNOSIS — Z96642 Presence of left artificial hip joint: Secondary | ICD-10-CM | POA: Insufficient documentation

## 2016-09-22 DIAGNOSIS — M4800 Spinal stenosis, site unspecified: Secondary | ICD-10-CM | POA: Diagnosis not present

## 2016-09-22 DIAGNOSIS — R001 Bradycardia, unspecified: Secondary | ICD-10-CM | POA: Diagnosis not present

## 2016-09-22 DIAGNOSIS — G459 Transient cerebral ischemic attack, unspecified: Principal | ICD-10-CM | POA: Diagnosis present

## 2016-09-22 DIAGNOSIS — Z90710 Acquired absence of both cervix and uterus: Secondary | ICD-10-CM | POA: Insufficient documentation

## 2016-09-22 DIAGNOSIS — Z8249 Family history of ischemic heart disease and other diseases of the circulatory system: Secondary | ICD-10-CM | POA: Diagnosis not present

## 2016-09-22 DIAGNOSIS — K219 Gastro-esophageal reflux disease without esophagitis: Secondary | ICD-10-CM | POA: Diagnosis not present

## 2016-09-22 DIAGNOSIS — M8580 Other specified disorders of bone density and structure, unspecified site: Secondary | ICD-10-CM | POA: Diagnosis not present

## 2016-09-22 DIAGNOSIS — I10 Essential (primary) hypertension: Secondary | ICD-10-CM | POA: Diagnosis not present

## 2016-09-22 DIAGNOSIS — R4701 Aphasia: Secondary | ICD-10-CM | POA: Diagnosis present

## 2016-09-22 DIAGNOSIS — H9190 Unspecified hearing loss, unspecified ear: Secondary | ICD-10-CM | POA: Diagnosis not present

## 2016-09-22 DIAGNOSIS — Z885 Allergy status to narcotic agent status: Secondary | ICD-10-CM | POA: Diagnosis not present

## 2016-09-22 DIAGNOSIS — I638 Other cerebral infarction: Secondary | ICD-10-CM

## 2016-09-22 DIAGNOSIS — Z7982 Long term (current) use of aspirin: Secondary | ICD-10-CM | POA: Insufficient documentation

## 2016-09-22 DIAGNOSIS — I340 Nonrheumatic mitral (valve) insufficiency: Secondary | ICD-10-CM | POA: Insufficient documentation

## 2016-09-22 DIAGNOSIS — Z820 Family history of epilepsy and other diseases of the nervous system: Secondary | ICD-10-CM | POA: Insufficient documentation

## 2016-09-22 DIAGNOSIS — M48061 Spinal stenosis, lumbar region without neurogenic claudication: Secondary | ICD-10-CM | POA: Insufficient documentation

## 2016-09-22 DIAGNOSIS — Z8673 Personal history of transient ischemic attack (TIA), and cerebral infarction without residual deficits: Secondary | ICD-10-CM | POA: Diagnosis not present

## 2016-09-22 DIAGNOSIS — Z9049 Acquired absence of other specified parts of digestive tract: Secondary | ICD-10-CM | POA: Insufficient documentation

## 2016-09-22 DIAGNOSIS — Z85828 Personal history of other malignant neoplasm of skin: Secondary | ICD-10-CM | POA: Diagnosis not present

## 2016-09-22 DIAGNOSIS — G9389 Other specified disorders of brain: Secondary | ICD-10-CM | POA: Insufficient documentation

## 2016-09-22 DIAGNOSIS — Z79899 Other long term (current) drug therapy: Secondary | ICD-10-CM | POA: Diagnosis not present

## 2016-09-22 DIAGNOSIS — Z85820 Personal history of malignant melanoma of skin: Secondary | ICD-10-CM | POA: Diagnosis not present

## 2016-09-22 DIAGNOSIS — E785 Hyperlipidemia, unspecified: Secondary | ICD-10-CM | POA: Diagnosis not present

## 2016-09-22 LAB — DIFFERENTIAL
BASOS ABS: 0 10*3/uL (ref 0.0–0.1)
BASOS PCT: 0 %
Eosinophils Absolute: 0.4 10*3/uL (ref 0.0–0.7)
Eosinophils Relative: 4 %
LYMPHS PCT: 31 %
Lymphs Abs: 3.2 10*3/uL (ref 0.7–4.0)
MONO ABS: 0.7 10*3/uL (ref 0.1–1.0)
Monocytes Relative: 7 %
NEUTROS ABS: 5.9 10*3/uL (ref 1.7–7.7)
NEUTROS PCT: 58 %

## 2016-09-22 LAB — COMPREHENSIVE METABOLIC PANEL
ALBUMIN: 4.1 g/dL (ref 3.5–5.0)
ALT: 9 U/L — AB (ref 14–54)
AST: 19 U/L (ref 15–41)
Alkaline Phosphatase: 45 U/L (ref 38–126)
Anion gap: 10 (ref 5–15)
BUN: 19 mg/dL (ref 6–20)
CHLORIDE: 108 mmol/L (ref 101–111)
CO2: 22 mmol/L (ref 22–32)
CREATININE: 1.01 mg/dL — AB (ref 0.44–1.00)
Calcium: 9.4 mg/dL (ref 8.9–10.3)
GFR calc non Af Amer: 48 mL/min — ABNORMAL LOW (ref >60)
GFR, EST AFRICAN AMERICAN: 55 mL/min — AB (ref >60)
GLUCOSE: 107 mg/dL — AB (ref 65–99)
Potassium: 4.1 mmol/L (ref 3.5–5.1)
SODIUM: 140 mmol/L (ref 135–145)
Total Bilirubin: 0.6 mg/dL (ref 0.3–1.2)
Total Protein: 6.6 g/dL (ref 6.5–8.1)

## 2016-09-22 LAB — I-STAT CHEM 8, ED
BUN: 25 mg/dL — ABNORMAL HIGH (ref 6–20)
CHLORIDE: 109 mmol/L (ref 101–111)
CREATININE: 1.1 mg/dL — AB (ref 0.44–1.00)
Calcium, Ion: 1.06 mmol/L — ABNORMAL LOW (ref 1.15–1.40)
Glucose, Bld: 104 mg/dL — ABNORMAL HIGH (ref 65–99)
HEMATOCRIT: 38 % (ref 36.0–46.0)
HEMOGLOBIN: 12.9 g/dL (ref 12.0–15.0)
POTASSIUM: 4.2 mmol/L (ref 3.5–5.1)
Sodium: 142 mmol/L (ref 135–145)
TCO2: 24 mmol/L (ref 0–100)

## 2016-09-22 LAB — TROPONIN I

## 2016-09-22 LAB — CBC
HCT: 37.1 % (ref 36.0–46.0)
Hemoglobin: 12.3 g/dL (ref 12.0–15.0)
MCH: 33.1 pg (ref 26.0–34.0)
MCHC: 33.2 g/dL (ref 30.0–36.0)
MCV: 99.7 fL (ref 78.0–100.0)
PLATELETS: 245 10*3/uL (ref 150–400)
RBC: 3.72 MIL/uL — ABNORMAL LOW (ref 3.87–5.11)
RDW: 14.7 % (ref 11.5–15.5)
WBC: 10.1 10*3/uL (ref 4.0–10.5)

## 2016-09-22 LAB — APTT: APTT: 28 s (ref 24–36)

## 2016-09-22 LAB — PROTIME-INR
INR: 0.98
PROTHROMBIN TIME: 13 s (ref 11.4–15.2)

## 2016-09-22 LAB — CBG MONITORING, ED: Glucose-Capillary: 103 mg/dL — ABNORMAL HIGH (ref 65–99)

## 2016-09-22 LAB — I-STAT TROPONIN, ED: Troponin i, poc: 0 ng/mL (ref 0.00–0.08)

## 2016-09-22 MED ORDER — STROKE: EARLY STAGES OF RECOVERY BOOK
Freq: Once | Status: AC
  Administered 2016-09-22: 21:00:00
  Filled 2016-09-22: qty 1

## 2016-09-22 MED ORDER — ACETAMINOPHEN 325 MG PO TABS: 650.0000 mg | ORAL_TABLET | ORAL | Status: DC | PRN

## 2016-09-22 MED ORDER — ASPIRIN EC 81 MG PO TBEC: 81.0000 mg | DELAYED_RELEASE_TABLET | Freq: Every day | ORAL | Status: DC

## 2016-09-22 MED ORDER — ACETAMINOPHEN 160 MG/5ML PO SOLN: 650.0000 mg | ORAL | Status: DC | PRN

## 2016-09-22 MED ORDER — SENNOSIDES-DOCUSATE SODIUM 8.6-50 MG PO TABS: 1.0000 | ORAL_TABLET | Freq: Every evening | ORAL | Status: DC | PRN

## 2016-09-22 MED ORDER — IOPAMIDOL (ISOVUE-370) INJECTION 76%
INTRAVENOUS | Status: AC
  Filled 2016-09-22: qty 50

## 2016-09-22 MED ORDER — ACETAMINOPHEN 650 MG RE SUPP: 650.0000 mg | RECTAL | Status: DC | PRN

## 2016-09-22 MED ORDER — SODIUM CHLORIDE 0.9 % IV SOLN
INTRAVENOUS | Status: DC
  Administered 2016-09-22: 23:00:00 via INTRAVENOUS

## 2016-09-22 MED ORDER — SIMVASTATIN 20 MG PO TABS
20.0000 mg | ORAL_TABLET | Freq: Every day | ORAL | Status: DC
  Administered 2016-09-22: 20 mg via ORAL
  Filled 2016-09-22: qty 1

## 2016-09-22 MED ORDER — ENOXAPARIN SODIUM 30 MG/0.3ML ~~LOC~~ SOLN
30.0000 mg | SUBCUTANEOUS | Status: DC
  Administered 2016-09-22: 30 mg via SUBCUTANEOUS
  Filled 2016-09-22: qty 0.3

## 2016-09-22 NOTE — ED Provider Notes (Signed)
[place] DEPT Provider Note   CSN: [id number] Arrival date & time: 09/22/16  1646   An emergency department physician performed an initial assessment on this suspected stroke patient at 1648 (cardama).  History   Chief Complaint Chief Complaint  Patient presents with  . Code Stroke    HPI Christine Lyons is a 83 y.o. female.  Patient is a 83 year old female with a history of bradycardia, hypertension, hyperlipidemia, stroke presenting today as a code stroke. Patient was at the orthopedist office. When she arrived at 4:00 she was noted to be normal and then by 4:30 patient was having word finding difficulty and some numbness on the right side of her body. She presented as a code stroke and stroke protocol was initiated. Patient currently has no complaints. Word finding difficulty has improved. She denies any numbness or weakness at this time. She had not taken her medications this morning but states she felt normal before going to her doctor appointment.   The history is provided by the patient.    Past Medical History:  Diagnosis Date  . Bradycardia   . Diverticulitis   . Esophageal reflux   . GERD (gastroesophageal reflux disease)   . Hip fracture (HCC)    left, Dr. Alvan Dame  . Hx of echocardiogram    a. Echo 09/08/12: EF [id number], grade 1 diastolic dysfunction.  . Hyperlipidemia   . Hypertension   . Osteopenia   . Personal history of other malignant neoplasm of skin   . Spinal stenosis   . Stroke [facility]) 1999   TIA in [place]  . Unspecified hearing loss     Patient Active Problem List   Diagnosis Date Noted  . Fall in home 12/24/2015  . At risk for falling 12/24/2015  . Basal cell carcinoma of ala nasi 04/26/2014  . HTN (hypertension) 08/30/2012  . Hypotension 08/30/2012  . TIA (transient ischemic attack) 08/30/2012  . Bradycardia 08/29/2012  . Hip fracture ([place]) 08/28/2012  . UNSPECIFIED HEARING LOSS 11/09/2010  . SPINAL STENOSIS, LUMBAR 03/23/2010  . LOW BACK PAIN  SYNDROME 03/23/2010  . HYPERLIPIDEMIA 08/27/2009  . HYPERTENSION 08/27/2009  . GERD 08/27/2009  . OSTEOPENIA 08/27/2009  . SKIN CANCER, HX OF 08/27/2009  . TRANSIENT ISCHEMIC ATTACK, HX OF 08/27/2009  . DIVERTICULITIS, HX OF 08/27/2009  . Bone/cartilage disorder 08/27/2009    Past Surgical History:  Procedure Laterality Date  . ABDOMINAL HYSTERECTOMY     with BSO for dysfunctional menses  . APPENDECTOMY    . CHOLECYSTECTOMY    . colonscopy     diverticulosis  . G 7 P 6    . HIP ARTHROPLASTY  08/29/2012   Procedure: ARTHROPLASTY BIPOLAR HIP;  Surgeon: Mauri Pole, MD;  Location: [facility] ORS;  Service: Orthopedics;  Laterality: Left;  LEFT HIP HEMIARTHROPLASTY  . LAPAROSCOPIC GASTRIC BANDING     to prevent GERD  . TONSILLECTOMY AND ADENOIDECTOMY      OB History    No data available       Home Medications    Prior to Admission medications   Medication Sig Start Date End Date Taking? Authorizing Provider  acetaminophen (TYLENOL) 325 MG tablet Take 2 tablets (650 mg total) by mouth every 6 (six) hours as needed for pain (or Fever >/= 101). 08/31/12   Monika Salk, MD  alum & mag hydroxide-simeth (MAALOX PLUS) 400-400-40 MG/5ML suspension Take 30 mLs by mouth every 6 (six) hours as needed.    Historical Provider, MD  amLODipine (NORVASC) 5  MG tablet Take 5 mg by mouth. 09/16/15   Historical Provider, MD  bisacodyl (DULCOLAX) 10 MG suppository Place 1 suppository (10 mg total) rectally daily as needed. 08/31/12   Monika Salk, MD  calcium citrate (CALCITRATE - DOSED IN MG ELEMENTAL CALCIUM) 950 MG tablet Take 1 tablet by mouth daily.    Historical Provider, MD  clopidogrel (PLAVIX) 75 MG tablet Take 1 tablet (75 mg total) by mouth daily. 05/17/13   Hendricks Limes, MD  docusate sodium (COLACE) 100 MG capsule Take 100 mg by mouth 2 (two) times daily.    Historical Provider, MD  ferrous sulfate 325 (65 FE) MG tablet Take 1 tablet (325 mg total) by mouth 3 (three) times daily after meals.  08/30/12   Danae Orleans, PA-C  losartan (COZAAR) 100 MG tablet APPOINTMENT DUE, 1 by mouth daily 12/04/12   Hendricks Limes, MD  Multiple Vitamins-Minerals (ICAPS PO) Take by mouth.    Historical Provider, MD  OxyCODONE HCl, Abuse Deter, 5 MG TABA Take 5 mg by mouth 4 (four) times daily as needed.    Historical Provider, MD  pantoprazole (PROTONIX) 40 MG tablet Take 40 mg by mouth daily.    Historical Provider, MD  polyethylene glycol (MIRALAX / GLYCOLAX) packet Take 17 g by mouth daily as needed. 08/30/12   Danae Orleans, PA-C  senna (SENOKOT) 8.6 MG TABS Take 2 tablets by mouth daily.    Historical Provider, MD  simvastatin (ZOCOR) 20 MG tablet Take 1 tablet (20 mg total) by mouth every evening. 01/06/11 01/06/12  Hendricks Limes, MD  simvastatin (ZOCOR) 20 MG tablet Take 20 mg by mouth every evening.    Historical Provider, MD  simvastatin (ZOCOR) 20 MG tablet APPOINTMENT DUE, 1 by mouth daily 12/04/12   Hendricks Limes, MD  traMADol (ULTRAM) 50 MG tablet Take 1 tablet (50 mg total) by mouth every 6 (six) hours as needed. 12/23/15   Larene Pickett, PA-C    Family History Family History  Problem Relation Age of Onset  . Hypertension Mother   . Dementia Father   . Heart disease Brother     died of an MI, late 45s    Social History Social History  Substance Use Topics  . Smoking status: Never Smoker  . Smokeless tobacco: Never Used  . Alcohol use 2.5 oz/week    5 drink(s) per week     Comment: Glass of wine almost every day     Allergies   Codeine   Review of Systems Review of Systems  All other systems reviewed and are negative.    Physical Exam Updated Vital Signs BP 157/75 (BP Location: Right Arm)   Pulse 79   Temp 98.3 F (36.8 C) (Oral)   Resp 10   Wt 125 lb 14.1 oz (57.1 kg)   SpO2 98%   BMI 23.79 kg/m   Physical Exam  Constitutional: She is oriented to person, place, and time. She appears well-developed and well-nourished. No distress.  HENT:  Head:  Normocephalic and atraumatic.  Mouth/Throat: Oropharynx is clear and moist.  Eyes: Conjunctivae and EOM are normal. Pupils are equal, round, and reactive to light.  Neck: Normal range of motion. Neck supple.  Cardiovascular: Normal rate, regular rhythm and intact distal pulses.   No murmur heard. Pulmonary/Chest: Effort normal and breath sounds normal. No respiratory distress. She has no wheezes. She has no rales.  Abdominal: Soft. She exhibits no distension. There is no tenderness. There is no  rebound and no guarding.  Musculoskeletal: Normal range of motion. She exhibits edema. She exhibits no tenderness.  Trace edema in the RLE  Neurological: She is alert and oriented to person, place, and time. A sensory deficit is present.  No aphasia noted currently. Mild sensory deficit in the right face, upper and lower extremity. Normal coordination.  5 out of 5 strength in all 4 extremities.  Skin: Skin is warm and dry. No rash noted. No erythema.  Psychiatric: She has a normal mood and affect. Her behavior is normal.  Nursing note and vitals reviewed.    ED Treatments / Results  Labs (all labs ordered are listed, but only abnormal results are displayed) Labs Reviewed  CBC - Abnormal; Notable for the following:       Result Value   RBC 3.72 (*)    All other components within normal limits  COMPREHENSIVE METABOLIC PANEL - Abnormal; Notable for the following:    Glucose, Bld 107 (*)    Creatinine, Ser 1.01 (*)    ALT 9 (*)    GFR calc non Af Amer 48 (*)    GFR calc Af Amer 55 (*)    All other components within normal limits  CBG MONITORING, ED - Abnormal; Notable for the following:    Glucose-Capillary 103 (*)    All other components within normal limits  I-STAT CHEM 8, ED - Abnormal; Notable for the following:    BUN 25 (*)    Creatinine, Ser 1.10 (*)    Glucose, Bld 104 (*)    Calcium, Ion 1.06 (*)    All other components within normal limits  PROTIME-INR  APTT  DIFFERENTIAL    I-STAT TROPOININ, ED    EKG  EKG Interpretation None       Radiology Ct Angio Head W Or Wo Contrast  Result Date: 09/22/2016 CLINICAL DATA:  83 y/o  F; aphasia. EXAM: CT ANGIOGRAPHY HEAD AND NECK TECHNIQUE: Multidetector CT imaging of the head and neck was performed using the standard protocol during bolus administration of intravenous contrast. Multiplanar CT image reconstructions and MIPs were obtained to evaluate the vascular anatomy. Carotid stenosis measurements (when applicable) are obtained utilizing NASCET criteria, using the distal internal carotid diameter as the denominator. CONTRAST:  50 cc Isovue 370 COMPARISON:  None. FINDINGS: CTA NECK FINDINGS Aortic arch: Standard branching. Imaged portion shows no evidence of aneurysm or dissection. Mixed plaque of the proximal left subclavian artery origin with 50% stenosis. Right carotid system: No evidence of dissection, stenosis (50% or greater) or occlusion. Left carotid system: No evidence of dissection, stenosis (50% or greater) or occlusion. Vertebral arteries: Left dominant. No evidence of dissection, stenosis (50% or greater) or occlusion. Skeleton: Moderate discogenic and facet degenerative changes of the cervical spine greatest from the C4 through C7 levels. Multiple levels of uncovertebral and facet hypertrophy with bony foraminal narrowing from C4 through C7. No high-grade bony canal stenosis. Productive changes of the anterior C1-2 articulation. No acute osseous abnormality is evident. Other neck: Negative. Upper chest: Negative. Review of the MIP images confirms the above findings CTA HEAD FINDINGS Anterior circulation: No significant stenosis, proximal occlusion, aneurysm, or vascular malformation. Moderate calcific atherosclerosis of cavernous and supraclinoid segments of ICA without significant stenosis. Posterior circulation: No significant stenosis, proximal occlusion, aneurysm, or vascular malformation. Venous sinuses: As  permitted by contrast timing, patent. Anatomic variants: Fenestrated anterior communicating artery. Fetal right posterior cerebral artery. No left posterior communicating artery identified. Review of the MIP images confirms  the above findings IMPRESSION: 1. No evidence of dissection, hemodynamically significant stenosis, or occlusion of the carotid and vertebral arteries of the neck. 2. No significant stenosis, proximal occlusion, aneurysm, or vascular malformation of the circle of Willis. 3. Left subclavian artery origin mixed plaque with 50% stenosis. 4. Moderate cervical spondylosis greatest from C4 through C7. These results were called by telephone at the time of interpretation on 09/22/2016 at 5:47 pm to Dr. Norman Clay , who verbally acknowledged these results. Electronically Signed   By: Kristine Garbe M.D.   On: 09/22/2016 17:48   Ct Angio Neck W Or Wo Contrast  Result Date: 09/22/2016 CLINICAL DATA:  83 y/o  F; aphasia. EXAM: CT ANGIOGRAPHY HEAD AND NECK TECHNIQUE: Multidetector CT imaging of the head and neck was performed using the standard protocol during bolus administration of intravenous contrast. Multiplanar CT image reconstructions and MIPs were obtained to evaluate the vascular anatomy. Carotid stenosis measurements (when applicable) are obtained utilizing NASCET criteria, using the distal internal carotid diameter as the denominator. CONTRAST:  50 cc Isovue 370 COMPARISON:  None. FINDINGS: CTA NECK FINDINGS Aortic arch: Standard branching. Imaged portion shows no evidence of aneurysm or dissection. Mixed plaque of the proximal left subclavian artery origin with 50% stenosis. Right carotid system: No evidence of dissection, stenosis (50% or greater) or occlusion. Left carotid system: No evidence of dissection, stenosis (50% or greater) or occlusion. Vertebral arteries: Left dominant. No evidence of dissection, stenosis (50% or greater) or occlusion. Skeleton: Moderate discogenic  and facet degenerative changes of the cervical spine greatest from the C4 through C7 levels. Multiple levels of uncovertebral and facet hypertrophy with bony foraminal narrowing from C4 through C7. No high-grade bony canal stenosis. Productive changes of the anterior C1-2 articulation. No acute osseous abnormality is evident. Other neck: Negative. Upper chest: Negative. Review of the MIP images confirms the above findings CTA HEAD FINDINGS Anterior circulation: No significant stenosis, proximal occlusion, aneurysm, or vascular malformation. Moderate calcific atherosclerosis of cavernous and supraclinoid segments of ICA without significant stenosis. Posterior circulation: No significant stenosis, proximal occlusion, aneurysm, or vascular malformation. Venous sinuses: As permitted by contrast timing, patent. Anatomic variants: Fenestrated anterior communicating artery. Fetal right posterior cerebral artery. No left posterior communicating artery identified. Review of the MIP images confirms the above findings IMPRESSION: 1. No evidence of dissection, hemodynamically significant stenosis, or occlusion of the carotid and vertebral arteries of the neck. 2. No significant stenosis, proximal occlusion, aneurysm, or vascular malformation of the circle of Willis. 3. Left subclavian artery origin mixed plaque with 50% stenosis. 4. Moderate cervical spondylosis greatest from C4 through C7. These results were called by telephone at the time of interpretation on 09/22/2016 at 5:47 pm to Dr. Norman Clay , who verbally acknowledged these results. Electronically Signed   By: Kristine Garbe M.D.   On: 09/22/2016 17:48   Ct Head Code Stroke W/o Cm  Result Date: 09/22/2016 CLINICAL DATA:  Code stroke.  Aphasia. EXAM: CT HEAD WITHOUT CONTRAST TECHNIQUE: Contiguous axial images were obtained from the base of the skull through the vertex without intravenous contrast. COMPARISON:  11/16/2006 CT head.  11/17/2006 MRI  head. FINDINGS: Brain: No evidence of acute infarction, hemorrhage, extra-axial collection or mass lesion/mass effect. Moderate brain parenchymal volume loss is progressed from 2008. Lateral and third ventriculomegaly is borderline for the degree of volume loss. Vascular: No hyperdense vessel. Calcific atherosclerosis of cavernous internal carotid arteries. Skull: Normal. Negative for fracture or focal lesion. Sinuses/Orbits: No acute finding. Other: None.  ASPECTS [facility] Stroke Program Early CT Score) - Ganglionic level infarction (caudate, lentiform nuclei, internal capsule, insula, M1-M3 cortex): 7 - Supraganglionic infarction (M4-M6 cortex): 3 Total score (0-10 with 10 being normal): 10 IMPRESSION: 1. No acute intracranial abnormality identified. If symptoms persist or if clinically indicated MRI is more sensitive for acute stroke. 2. ASPECTS is 10 3. Moderate parenchymal volume loss of the brain is progressed from 2008. Electronically Signed   By: Kristine Garbe M.D.   On: 09/22/2016 17:07    Procedures Procedures (including critical care time)  Medications Ordered in ED Medications  iopamidol (ISOVUE-370) 76 % injection (not administered)     Initial Impression / Assessment and Plan / ED Course  I have reviewed the triage vital signs and the nursing notes.  Pertinent labs & imaging results that were available during my care of the patient were reviewed by me and considered in my medical decision making (see chart for details).  Clinical Course    Patient is a 83 year old female presenting today as a code stroke. Symptoms started between 4 and 4:30 while at the orthopedist office. Included aphasia and sensory deficit in the right side. She was an NIH of 3 when EMS arrived and has improved to an NIH of 2. Initial head CT was negative and she went for a CTA. Labs without significant findings. Patient will be admitted for stroke workup.  On my evaluation aphasia has a most completely  resolved.  Final Clinical Impressions(s) / ED Diagnoses   Final diagnoses:  Transient cerebral ischemia, unspecified type    New Prescriptions New Prescriptions   No medications on file     Blanchie Dessert, MD 09/23/16 [id number]

## 2016-09-22 NOTE — H&P (Signed)
History and Physical    Christine Lyons [id number] DOB: May 26, 1925 DOA: 09/22/2016  PCP: Antony Salmon, resident physician at [facility] Consultants:  [facility] - orthopedics; [place] - dermatology; Buccini - GI;  Patient coming from: [place]; Donald Prose: daughter, [phone]  Chief Complaint: aphasia  HPI: Christine Lyons is a 83 y.o. female with medical history significant of TIA, HTN, HLD presenting after she went to orthopedics for a knee injection and developed acute aphasia.  Had been fine for the first 45 minutes.  Suddenly stuttered, couldn't get words out.  She knew what she wanted to say but it wouldn't come out.  They gaver her ASA and it got a bit better.  When the ambulance arrived, she couldn't repeat their phrases at all.  Still doesn't feel really good.  Has a feeling in her head like she needs to go to sleep, doesn't feel herself.  Within an hour of the onset she was able to converse again.  No numbness/weakness throughout.  No dysphagia, she felt very thirsty and they wouldn't give her anything to drink.  Was able to take ASA with water without difficulty.   ED Course: Per Dr. Maryan Rued: Patient is a 83 year old female presenting today as a code stroke. Symptoms started between 4 and 4:30 while at the orthopedist office. Included aphasia and sensory deficit in the right side. She was an NIH of 3 when EMS arrived and has improved to an NIH of 2. Initial head CT was negative and she went for a CTA. Labs without significant findings. Patient will be admitted for stroke workup. On my evaluation aphasia has a most completely resolved.   Review of Systems: As per HPI; otherwise 10 point review of systems reviewed and negative.   Ambulatory Status:   ambulates with cane, walker  Past Medical History:  Diagnosis Date  . Bradycardia   . Diverticulitis   . Esophageal reflux   . GERD (gastroesophageal reflux disease)   . Hip fracture (HCC)    left, Dr. Alvan Dame  . Hx of echocardiogram    a. Echo  09/08/12: EF [id number], grade 1 diastolic dysfunction.  . Hyperlipidemia   . Hypertension   . Osteopenia   . Personal history of other malignant neoplasm of skin   . Spinal stenosis   . Stroke [facility]) 1999   TIA in [place]  . Unspecified hearing loss     Past Surgical History:  Procedure Laterality Date  . ABDOMINAL HYSTERECTOMY     with BSO for dysfunctional menses  . APPENDECTOMY    . CHOLECYSTECTOMY    . colonscopy     diverticulosis  . G 7 P 6    . HIP ARTHROPLASTY  08/29/2012   Procedure: ARTHROPLASTY BIPOLAR HIP;  Surgeon: Mauri Pole, MD;  Location: [facility] ORS;  Service: Orthopedics;  Laterality: Left;  LEFT HIP HEMIARTHROPLASTY  . LAPAROSCOPIC GASTRIC BANDING     to prevent GERD  . TONSILLECTOMY AND ADENOIDECTOMY      Social History   Social History  . Marital status: Married    Spouse name: N/A  . Number of children: N/A  . Years of education: N/A   Occupational History  . Retired.    Social History Main Topics  . Smoking status: Never Smoker  . Smokeless tobacco: Never Used  . Alcohol use 2.5 oz/week    5 Standard drinks or equivalent per week     Comment: 1-2 glasses of wine most nights  . Drug use: No  . Sexual activity:  No   Other Topics Concern  . Not on file   Social History Narrative   Lives independently at [place]. Retired.    Allergies  Allergen Reactions  . Codeine Nausea Only and Anxiety    Family History  Problem Relation Age of Onset  . Hypertension Mother   . Dementia Father   . Heart disease Brother     died of an MI, late 85s    Prior to Admission medications   Medication Sig Start Date End Date Taking? Authorizing Provider  acetaminophen (TYLENOL) 325 MG tablet Take 2 tablets (650 mg total) by mouth every 6 (six) hours as needed for pain (or Fever >/= 101). 08/31/12   Monika Salk, MD  alum & mag hydroxide-simeth (MAALOX PLUS) 400-400-40 MG/5ML suspension Take 30 mLs by mouth every 6 (six) hours as needed.    Historical Provider, MD   amLODipine (NORVASC) 5 MG tablet Take 5 mg by mouth. 09/16/15   Historical Provider, MD  bisacodyl (DULCOLAX) 10 MG suppository Place 1 suppository (10 mg total) rectally daily as needed. 08/31/12   Monika Salk, MD  calcium citrate (CALCITRATE - DOSED IN MG ELEMENTAL CALCIUM) 950 MG tablet Take 1 tablet by mouth daily.    Historical Provider, MD  clopidogrel (PLAVIX) 75 MG tablet Take 1 tablet (75 mg total) by mouth daily. 05/17/13   Hendricks Limes, MD  docusate sodium (COLACE) 100 MG capsule Take 100 mg by mouth 2 (two) times daily.    Historical Provider, MD  ferrous sulfate 325 (65 FE) MG tablet Take 1 tablet (325 mg total) by mouth 3 (three) times daily after meals. 08/30/12   Danae Orleans, PA-C  losartan (COZAAR) 100 MG tablet APPOINTMENT DUE, 1 by mouth daily 12/04/12   Hendricks Limes, MD  Multiple Vitamins-Minerals (ICAPS PO) Take by mouth.    Historical Provider, MD  OxyCODONE HCl, Abuse Deter, 5 MG TABA Take 5 mg by mouth 4 (four) times daily as needed.    Historical Provider, MD  pantoprazole (PROTONIX) 40 MG tablet Take 40 mg by mouth daily.    Historical Provider, MD  polyethylene glycol (MIRALAX / GLYCOLAX) packet Take 17 g by mouth daily as needed. 08/30/12   Danae Orleans, PA-C  senna (SENOKOT) 8.6 MG TABS Take 2 tablets by mouth daily.    Historical Provider, MD  simvastatin (ZOCOR) 20 MG tablet Take 1 tablet (20 mg total) by mouth every evening. 01/06/11 01/06/12  Hendricks Limes, MD  simvastatin (ZOCOR) 20 MG tablet Take 20 mg by mouth every evening.    Historical Provider, MD  simvastatin (ZOCOR) 20 MG tablet APPOINTMENT DUE, 1 by mouth daily 12/04/12   Hendricks Limes, MD  traMADol (ULTRAM) 50 MG tablet Take 1 tablet (50 mg total) by mouth every 6 (six) hours as needed. 12/23/15   Larene Pickett, PA-C    Physical Exam: Vitals:   09/22/16 2015 09/22/16 2048 09/22/16 2250 09/23/16 0050  BP:  (!) 164/64 (!) 164/64 (!) 175/55  Pulse: 63 64 63 69  Resp: 14 20 18 16   Temp:  98  F (36.7 C) 98.1 F (36.7 C) 97.9 F (36.6 C)  TempSrc:  Oral Oral Oral  SpO2: 96% 98% 96% 97%  Weight:  51.8 kg (114 lb 1.6 oz)    Height:  5\' 1"  (1.549 m)       General: Appears calm and comfortable and is NAD Eyes:  PERRL, EOMI, normal lids, iris ENT:  Hard of  Hearing, normal lips & tongue, mmm Neck:  no LAD, masses or thyromegaly Cardiovascular:  RRR, no m/r/g. No LE edema.  Respiratory:  CTA bilaterally, no w/r/r. Normal respiratory effort. Abdomen:  soft, ntnd, NABS Skin:  no rash or induration seen on limited exam Musculoskeletal:  grossly normal tone BUE/BLE, good ROM, no bony abnormality Psychiatric:  grossly normal mood and affect, speech fluent and appropriate, AOx3 Neurologic:  CN 2-12 grossly intact, moves all extremities in coordinated fashion, sensation intact  Labs on Admission: I have personally reviewed following labs and imaging studies  CBC:  Recent Labs Lab 09/22/16 1650 09/22/16 1655  WBC 10.1  --   NEUTROABS 5.9  --   HGB 12.3 12.9  HCT 37.1 38.0  MCV 99.7  --   PLT 245  --    Basic Metabolic Panel:  Recent Labs Lab 09/22/16 1650 09/22/16 1655  NA 140 142  K 4.1 4.2  CL 108 109  CO2 22  --   GLUCOSE 107* 104*  BUN 19 25*  CREATININE 1.01* 1.10*  CALCIUM 9.4  --    GFR: Estimated Creatinine Clearance: 25.7 mL/min (by C-G formula based on SCr of 1.1 mg/dL (H)). Liver Function Tests:  Recent Labs Lab 09/22/16 1650  AST 19  ALT 9*  ALKPHOS 45  BILITOT 0.6  PROT 6.6  ALBUMIN 4.1   No results for input(s): LIPASE, AMYLASE in the last 168 hours. No results for input(s): AMMONIA in the last 168 hours. Coagulation Profile:  Recent Labs Lab 09/22/16 1650  INR 0.98   Cardiac Enzymes:  Recent Labs Lab 09/22/16 2127  TROPONINI <0.03   BNP (last 3 results) No results for input(s): PROBNP in the last 8760 hours. HbA1C: No results for input(s): HGBA1C in the last 72 hours. CBG:  Recent Labs Lab 09/22/16 1649  GLUCAP  103*   Lipid Profile: No results for input(s): CHOL, HDL, LDLCALC, TRIG, CHOLHDL, LDLDIRECT in the last 72 hours. Thyroid Function Tests: No results for input(s): TSH, T4TOTAL, FREET4, T3FREE, THYROIDAB in the last 72 hours. Anemia Panel: No results for input(s): VITAMINB12, FOLATE, FERRITIN, TIBC, IRON, RETICCTPCT in the last 72 hours. Urine analysis:    Component Value Date/Time   COLORURINE YELLOW 08/28/2012 [place] 08/28/2012 1453   LABSPEC 1.024 08/28/2012 1453   PHURINE 6.0 08/28/2012 1453   GLUCOSEU NEGATIVE 08/28/2012 1453   HGBUR NEGATIVE 08/28/2012 1453   BILIRUBINUR NEGATIVE 08/28/2012 1453   KETONESUR 40 (A) 08/28/2012 1453   PROTEINUR NEGATIVE 08/28/2012 1453   UROBILINOGEN 0.2 08/28/2012 1453   NITRITE NEGATIVE 08/28/2012 1453   LEUKOCYTESUR NEGATIVE 08/28/2012 1453    Creatinine Clearance: Estimated Creatinine Clearance: 25.7 mL/min (by C-G formula based on SCr of 1.1 mg/dL (H)).  Sepsis Labs: @LABRCNTIP (procalcitonin:4,lacticidven:4) )No results found for this or any previous visit (from the past 240 hour(s)).   Radiological Exams on Admission: Ct Angio Head W Or Wo Contrast  Result Date: 09/22/2016 CLINICAL DATA:  83 y/o  F; aphasia. EXAM: CT ANGIOGRAPHY HEAD AND NECK TECHNIQUE: Multidetector CT imaging of the head and neck was performed using the standard protocol during bolus administration of intravenous contrast. Multiplanar CT image reconstructions and MIPs were obtained to evaluate the vascular anatomy. Carotid stenosis measurements (when applicable) are obtained utilizing NASCET criteria, using the distal internal carotid diameter as the denominator. CONTRAST:  50 cc Isovue 370 COMPARISON:  None. FINDINGS: CTA NECK FINDINGS Aortic arch: Standard branching. Imaged portion shows no evidence of aneurysm or dissection. Mixed  plaque of the proximal left subclavian artery origin with 50% stenosis. Right carotid system: No evidence of dissection,  stenosis (50% or greater) or occlusion. Left carotid system: No evidence of dissection, stenosis (50% or greater) or occlusion. Vertebral arteries: Left dominant. No evidence of dissection, stenosis (50% or greater) or occlusion. Skeleton: Moderate discogenic and facet degenerative changes of the cervical spine greatest from the C4 through C7 levels. Multiple levels of uncovertebral and facet hypertrophy with bony foraminal narrowing from C4 through C7. No high-grade bony canal stenosis. Productive changes of the anterior C1-2 articulation. No acute osseous abnormality is evident. Other neck: Negative. Upper chest: Negative. Review of the MIP images confirms the above findings CTA HEAD FINDINGS Anterior circulation: No significant stenosis, proximal occlusion, aneurysm, or vascular malformation. Moderate calcific atherosclerosis of cavernous and supraclinoid segments of ICA without significant stenosis. Posterior circulation: No significant stenosis, proximal occlusion, aneurysm, or vascular malformation. Venous sinuses: As permitted by contrast timing, patent. Anatomic variants: Fenestrated anterior communicating artery. Fetal right posterior cerebral artery. No left posterior communicating artery identified. Review of the MIP images confirms the above findings IMPRESSION: 1. No evidence of dissection, hemodynamically significant stenosis, or occlusion of the carotid and vertebral arteries of the neck. 2. No significant stenosis, proximal occlusion, aneurysm, or vascular malformation of the circle of Willis. 3. Left subclavian artery origin mixed plaque with 50% stenosis. 4. Moderate cervical spondylosis greatest from C4 through C7. These results were called by telephone at the time of interpretation on 09/22/2016 at 5:47 pm to Dr. Norman Clay , who verbally acknowledged these results. Electronically Signed   By: Kristine Garbe M.D.   On: 09/22/2016 17:48   Ct Angio Neck W Or Wo Contrast  Result  Date: 09/22/2016 CLINICAL DATA:  83 y/o  F; aphasia. EXAM: CT ANGIOGRAPHY HEAD AND NECK TECHNIQUE: Multidetector CT imaging of the head and neck was performed using the standard protocol during bolus administration of intravenous contrast. Multiplanar CT image reconstructions and MIPs were obtained to evaluate the vascular anatomy. Carotid stenosis measurements (when applicable) are obtained utilizing NASCET criteria, using the distal internal carotid diameter as the denominator. CONTRAST:  50 cc Isovue 370 COMPARISON:  None. FINDINGS: CTA NECK FINDINGS Aortic arch: Standard branching. Imaged portion shows no evidence of aneurysm or dissection. Mixed plaque of the proximal left subclavian artery origin with 50% stenosis. Right carotid system: No evidence of dissection, stenosis (50% or greater) or occlusion. Left carotid system: No evidence of dissection, stenosis (50% or greater) or occlusion. Vertebral arteries: Left dominant. No evidence of dissection, stenosis (50% or greater) or occlusion. Skeleton: Moderate discogenic and facet degenerative changes of the cervical spine greatest from the C4 through C7 levels. Multiple levels of uncovertebral and facet hypertrophy with bony foraminal narrowing from C4 through C7. No high-grade bony canal stenosis. Productive changes of the anterior C1-2 articulation. No acute osseous abnormality is evident. Other neck: Negative. Upper chest: Negative. Review of the MIP images confirms the above findings CTA HEAD FINDINGS Anterior circulation: No significant stenosis, proximal occlusion, aneurysm, or vascular malformation. Moderate calcific atherosclerosis of cavernous and supraclinoid segments of ICA without significant stenosis. Posterior circulation: No significant stenosis, proximal occlusion, aneurysm, or vascular malformation. Venous sinuses: As permitted by contrast timing, patent. Anatomic variants: Fenestrated anterior communicating artery. Fetal right posterior  cerebral artery. No left posterior communicating artery identified. Review of the MIP images confirms the above findings IMPRESSION: 1. No evidence of dissection, hemodynamically significant stenosis, or occlusion of the carotid and vertebral arteries of  the neck. 2. No significant stenosis, proximal occlusion, aneurysm, or vascular malformation of the circle of Willis. 3. Left subclavian artery origin mixed plaque with 50% stenosis. 4. Moderate cervical spondylosis greatest from C4 through C7. These results were called by telephone at the time of interpretation on 09/22/2016 at 5:47 pm to Dr. Norman Clay , who verbally acknowledged these results. Electronically Signed   By: Kristine Garbe M.D.   On: 09/22/2016 17:48   Ct Head Code Stroke W/o Cm  Result Date: 09/22/2016 CLINICAL DATA:  Code stroke.  Aphasia. EXAM: CT HEAD WITHOUT CONTRAST TECHNIQUE: Contiguous axial images were obtained from the base of the skull through the vertex without intravenous contrast. COMPARISON:  11/16/2006 CT head.  11/17/2006 MRI head. FINDINGS: Brain: No evidence of acute infarction, hemorrhage, extra-axial collection or mass lesion/mass effect. Moderate brain parenchymal volume loss is progressed from 2008. Lateral and third ventriculomegaly is borderline for the degree of volume loss. Vascular: No hyperdense vessel. Calcific atherosclerosis of cavernous internal carotid arteries. Skull: Normal. Negative for fracture or focal lesion. Sinuses/Orbits: No acute finding. Other: None. ASPECTS [facility] Stroke Program Early CT Score) - Ganglionic level infarction (caudate, lentiform nuclei, internal capsule, insula, M1-M3 cortex): 7 - Supraganglionic infarction (M4-M6 cortex): 3 Total score (0-10 with 10 being normal): 10 IMPRESSION: 1. No acute intracranial abnormality identified. If symptoms persist or if clinically indicated MRI is more sensitive for acute stroke. 2. ASPECTS is 10 3. Moderate parenchymal volume loss of  the brain is progressed from 2008. Electronically Signed   By: Kristine Garbe M.D.   On: 09/22/2016 17:07    EKG: Independently reviewed.  NSR with rate 86;  no evidence of acute ischemia  Assessment/Plan Principal Problem:   TIA (transient ischemic attack) Active Problems:   Hyperlipidemia   HTN (hypertension)    CVA -Sudden onset of expressive aphasia and mild weakness concerning for TIA/CVA -Symptoms are now completely resolved -Will observe overnight for CVA/TIA evaluation -Telemetry monitoring -MRI/MRA -Carotid dopplers -Echo -Risk stratification with FLP; will also check TSH  -Has been taking inconsistent in taking ASA daily so needs to become compliant, wil increase to 325 mg daily -PT/OT/ST/Nutrition Consults  HTN -Allow permissive HTN -Treat BP only if >220/120, and then with goal of 15% reduction -Hold CCB and ARB and plan to restart in 48-72 hours  HLD -Check FLP -Consider change in statin therapy if FLP is not at goal (currently on Zocor 20)    DVT prophylaxis:  Lovenox  Code Status: DNR - confirmed with patient/family Family Communication: Daughter and son-in-law present throughout evaluation Disposition Plan:  Home once clinically improved Consults called: Neurology Admission status: It is my clinical opinion that referral for OBSERVATION is reasonable and necessary in this patient based on the above information provided. The aforementioned taken together are felt to place the patient at high risk for further clinical deterioration. However it is anticipated that the patient may be medically stable for discharge from the hospital within 24 to 48 hours.  Karmen Bongo MD [facility]  If 7PM-7AM, please contact night-coverage [email or web address] Password [email or web address]  09/23/2016, 1:39 AM

## 2016-09-22 NOTE — ED Triage Notes (Signed)
Per EMS - pt coming from [employer]. LKW 1600. Pt began having aphasia and slurred speech while at appt, around 1630. Pt had slight facial droop with EMS.

## 2016-09-22 NOTE — ED Notes (Signed)
Code Stroke page sent out @ [phone].

## 2016-09-22 NOTE — Consult Note (Signed)
Requesting Physician: Dr. Maryan Rued    Chief Complaint: code stroke, expressive aphasia  History obtained from:  Patient     HPI:                                                                                                                                         Christine Lyons is an 83 y.o. female with hx as below presents from her orthopedic appointment for sudden onset of expressive aphasia at 4pm.  Date last known well: 09/22/16 Time last known well: 4pm  tPA Given: No: resolving symptoms and now NIHSS 2   Past Medical History:  Diagnosis Date  . Bradycardia   . Diverticulitis   . Esophageal reflux   . GERD (gastroesophageal reflux disease)   . Hip fracture (HCC)    left, Dr. Alvan Dame  . Hx of echocardiogram    a. Echo 09/08/12: EF [id number], grade 1 diastolic dysfunction.  . Hyperlipidemia   . Hypertension   . Osteopenia   . Personal history of other malignant neoplasm of skin   . Spinal stenosis   . Stroke [facility]) 1999   TIA in [place]  . Unspecified hearing loss     Past Surgical History:  Procedure Laterality Date  . ABDOMINAL HYSTERECTOMY     with BSO for dysfunctional menses  . APPENDECTOMY    . CHOLECYSTECTOMY    . colonscopy     diverticulosis  . G 7 P 6    . HIP ARTHROPLASTY  08/29/2012   Procedure: ARTHROPLASTY BIPOLAR HIP;  Surgeon: Mauri Pole, MD;  Location: [facility];  Service: Orthopedics;  Laterality: Left;  LEFT HIP HEMIARTHROPLASTY  . LAPAROSCOPIC GASTRIC BANDING     to prevent GERD  . TONSILLECTOMY AND ADENOIDECTOMY      Family History  Problem Relation Age of Onset  . Hypertension Mother   . Dementia Father   . Heart disease Brother     died of an MI, late 67s   Social History:  reports that she has never smoked. She has never used smokeless tobacco. She reports that she drinks about 2.5 oz of alcohol per week . She reports that she does not use drugs.  Allergies:  Allergies  Allergen Reactions  . Codeine Nausea Only and Anxiety     Medications:  I have reviewed the patient's current medications.  ROS:                                                                                                                                       History obtained from chart review and the patient  General ROS: negative for - chills, fatigue, fever, night sweats, weight gain or weight loss Psychological ROS: negative for - behavioral disorder, hallucinations, memory difficulties, mood swings or suicidal ideation Ophthalmic ROS: negative for - blurry vision, double vision, eye pain or loss of vision ENT ROS: negative for - epistaxis, nasal discharge, oral lesions, sore throat, tinnitus or vertigo Allergy and Immunology ROS: negative for - hives or itchy/watery eyes Hematological and Lymphatic ROS: negative for - bleeding problems, bruising or swollen lymph nodes Endocrine ROS: negative for - galactorrhea, hair pattern changes, polydipsia/polyuria or temperature intolerance Respiratory ROS: negative for - cough, hemoptysis, shortness of breath or wheezing Cardiovascular ROS: negative for - chest pain, dyspnea on exertion, edema or irregular heartbeat Gastrointestinal ROS: negative for - abdominal pain, diarrhea, hematemesis, nausea/vomiting or stool incontinence Genito-Urinary ROS: negative for - dysuria, hematuria, incontinence or urinary frequency/urgency Musculoskeletal ROS: negative for - joint swelling or muscular weakness Neurological ROS: as noted in HPI Dermatological ROS: negative for rash and skin lesion changes  Neurologic Examination:                                                                                                      Blood pressure 157/75, pulse 79, temperature 98.3 F (36.8 C), temperature source Oral, resp. rate 10, weight 57.1 kg (125 lb 14.1 oz), SpO2 98 %.  HEENT-   Normocephalic, no lesions, without obvious abnormality.  Normal external eye and conjunctiva.  Normal TM's bilaterally.  Normal auditory canals and external ears. Normal external nose, mucus membranes and septum.  Normal pharynx. Cardiovascular- regular rate and rhythm, S1, S2 normal, no murmur, click, rub or gallop, pulses palpable throughout   Lungs- chest clear, no wheezing, rales, normal symmetric air entry, Heart exam - S1, S2 normal, no murmur, no gallop, rate regular Abdomen- soft, non-tender; bowel sounds normal; no masses,  no organomegaly  Neurological Examination Mental Status: Alert, oriented, thought content appropriate.  Speech fluent without evidence of aphasia.  Able to follow 3 step commands without difficulty. Cranial Nerves: II: Discs flat bilaterally; Visual fields grossly normal,  III,IV, VI: ptosis not present, extra-ocular motions intact bilaterally, pupils equal, round, reactive to light and accommodation V,VII: mild  r facial droop, facial light touch sensation normal bilaterally VIII: hearing normal bilaterally IX,X: uvula rises symmetrically XI: bilateral shoulder shrug XII: midline tongue extension Motor: Right : Upper extremity   5/5    Left:     Upper extremity   5/5  Lower extremity   5/5     Lower extremity   5/5 Tone and bulk:normal tone throughout; no atrophy noted Sensory: Pinprick and light touch decreased on the R arm and face Deep Tendon Reflexes: 2+ and symmetric throughout  Cerebellar: normal finger-to-nose,       Lab Results: Basic Metabolic Panel:  Recent Labs Lab 09/22/16 1650 09/22/16 1655  NA 140 142  K 4.1 4.2  CL 108 109  CO2 22  --   GLUCOSE 107* 104*  BUN 19 25*  CREATININE 1.01* 1.10*  CALCIUM 9.4  --     Liver Function Tests:  Recent Labs Lab 09/22/16 1650  AST 19  ALT 9*  ALKPHOS 45  BILITOT 0.6  PROT 6.6  ALBUMIN 4.1   No results for input(s): LIPASE, AMYLASE in the last 168 hours. No results for input(s):  AMMONIA in the last 168 hours.  CBC:  Recent Labs Lab 09/22/16 1650 09/22/16 1655  WBC 10.1  --   NEUTROABS 5.9  --   HGB 12.3 12.9  HCT 37.1 38.0  MCV 99.7  --   PLT 245  --     Cardiac Enzymes: No results for input(s): CKTOTAL, CKMB, CKMBINDEX, TROPONINI in the last 168 hours.  Lipid Panel: No results for input(s): CHOL, TRIG, HDL, CHOLHDL, VLDL, LDLCALC in the last 168 hours.  CBG:  Recent Labs Lab 09/22/16 1649  GLUCAP 103*    Microbiology: Results for orders placed or performed during the hospital encounter of 08/28/12  Urine culture     Status: None   Collection Time: 08/28/12  2:53 PM  Result Value Ref Range Status   Specimen Description URINE, CATHETERIZED  Final   Special Requests NONE  Final   Culture  Setup Time 08/28/2012 22:45  Final   Colony Count NO GROWTH  Final   Culture NO GROWTH  Final   Report Status 08/30/2012 FINAL  Final  Surgical pcr screen     Status: None   Collection Time: 08/28/12 11:42 PM  Result Value Ref Range Status   MRSA, PCR NEGATIVE NEGATIVE Final   Staphylococcus aureus NEGATIVE NEGATIVE Final    Comment:        The Xpert SA Assay (FDA approved for NASAL specimens in patients over 30 years of age), is one component of a comprehensive surveillance program.  Test performance has been validated by [employer] for patients greater than or equal to 91 year old. It is not intended to diagnose infection nor to guide or monitor treatment.    Coagulation Studies:  Recent Labs  09/22/16 1650  LABPROT 13.0  INR 0.98    Imaging: Ct Head Code Stroke W/o Cm  Result Date: 09/22/2016 CLINICAL DATA:  Code stroke.  Aphasia. EXAM: CT HEAD WITHOUT CONTRAST TECHNIQUE: Contiguous axial images were obtained from the base of the skull through the vertex without intravenous contrast. COMPARISON:  11/16/2006 CT head.  11/17/2006 MRI head. FINDINGS: Brain: No evidence of acute infarction, hemorrhage, extra-axial collection or mass  lesion/mass effect. Moderate brain parenchymal volume loss is progressed from 2008. Lateral and third ventriculomegaly is borderline for the degree of volume loss. Vascular: No hyperdense vessel. Calcific atherosclerosis of cavernous internal carotid arteries. Skull: Normal.  Negative for fracture or focal lesion. Sinuses/Orbits: No acute finding. Other: None. ASPECTS [facility] Stroke Program Early CT Score) - Ganglionic level infarction (caudate, lentiform nuclei, internal capsule, insula, M1-M3 cortex): 7 - Supraganglionic infarction (M4-M6 cortex): 3 Total score (0-10 with 10 being normal): 10 IMPRESSION: 1. No acute intracranial abnormality identified. If symptoms persist or if clinically indicated MRI is more sensitive for acute stroke. 2. ASPECTS is 10 3. Moderate parenchymal volume loss of the brain is progressed from 2008. Electronically Signed   By: Kristine Garbe M.D.   On: 09/22/2016 17:07       Assessment and plan discussed with with attending physician and they are in agreement.    Etta Quill PA-C [facility] [phone]  09/22/2016, 5:35 PM   Assessment: 83 y.o. female with hx as below presents from her orthopedic appointment for sudden onset of expressive aphasia at 4pm.  Stroke Risk Factors - hypertension   NIHSS 2  Rapidly improving symptoms  1. HgbA1c, fasting lipid panel 2. MRI, MRA  of the brain without contrast 3. PT consult, OT consult, Speech consult 4. Echocardiogram 5. Carotid dopplers 6. Prophylactic therapy-Antiplatelet med: Aspirin - dose 325 7. Risk factor modification 8. Telemetry monitoring 9. Frequent neuro checks 10 NPO until passes stroke swallow screen 11 please page stroke NP  Or  PA  Or MD from 8am -4 pm  as this patient from this time will be  followed by the stroke.   You can look them up on [email or web address]  Password [email or web address]

## 2016-09-22 NOTE — Progress Notes (Signed)
Patient arrived to floor via ED staff with family at bedside. MD paged on patients arrival for orders. Vitals stable. Tele applied and verified. Will continue to monitor.

## 2016-09-22 NOTE — Code Documentation (Signed)
83yo female arriving to [facility] via [facility] at 4.  Patient was at the orthopedic office d/t right knee issues and while talking to staff developed sudden onset expressive aphasia.  EMS called and activated a code stroke.  Stroke team at the bedside on patient arrival.  Labs drawn and patient cleared for CT by Dr. Leonette Monarch.  Patient to CT with team.  CT completed.  NIHSS 2, see documentation for details and code stroke times.  Patient with slight right facial asymmetry and reports decreased sensation to the right arm and leg on exam.  Patient's symptoms are too mild to treat with tPA, however, patient remains in the window to treat with tPA until 2030 should symptoms worsen.  Patient to CT for CTA head and neck per Dr. Cristobal Goldmann.  CTA completed, MD made aware.  Patient transported back to E42.  Bedside handoff with ED RN Jerene Pitch.

## 2016-09-23 ENCOUNTER — Observation Stay (HOSPITAL_COMMUNITY): Payer: Medicare Other

## 2016-09-23 ENCOUNTER — Encounter (HOSPITAL_COMMUNITY): Payer: Self-pay | Admitting: *Deleted

## 2016-09-23 ENCOUNTER — Encounter (HOSPITAL_COMMUNITY): Payer: Medicare Other

## 2016-09-23 ENCOUNTER — Observation Stay (HOSPITAL_COMMUNITY)
Admit: 2016-09-23 | Discharge: 2016-09-23 | Disposition: A | Discharge status: Home / Self Care | Payer: Medicare Other | Attending: Nurse Practitioner | Admitting: Nurse Practitioner

## 2016-09-23 DIAGNOSIS — E785 Hyperlipidemia, unspecified: Secondary | ICD-10-CM | POA: Diagnosis not present

## 2016-09-23 DIAGNOSIS — R4701 Aphasia: Secondary | ICD-10-CM | POA: Diagnosis not present

## 2016-09-23 DIAGNOSIS — G459 Transient cerebral ischemic attack, unspecified: Secondary | ICD-10-CM | POA: Diagnosis not present

## 2016-09-23 DIAGNOSIS — G458 Other transient cerebral ischemic attacks and related syndromes: Secondary | ICD-10-CM | POA: Diagnosis not present

## 2016-09-23 DIAGNOSIS — I10 Essential (primary) hypertension: Secondary | ICD-10-CM | POA: Diagnosis not present

## 2016-09-23 LAB — ECHOCARDIOGRAM COMPLETE
Height: 61 in
Weight: 1825.6 oz

## 2016-09-23 LAB — MRSA PCR SCREENING: MRSA BY PCR: NEGATIVE

## 2016-09-23 LAB — TROPONIN I

## 2016-09-23 LAB — LIPID PANEL
CHOLESTEROL: 137 mg/dL (ref 0–200)
HDL: 58 mg/dL (ref >40)
LDL Cholesterol: 65 mg/dL (ref 0–99)
TRIGLYCERIDES: 71 mg/dL (ref <150)
Total CHOL/HDL Ratio: 2.4 RATIO
VLDL: 14 mg/dL (ref 0–40)

## 2016-09-23 LAB — TSH: TSH: 2.185 u[IU]/mL (ref 0.350–4.500)

## 2016-09-23 MED ORDER — AMLODIPINE BESYLATE 5 MG PO TABS: 5.0000 mg | ORAL_TABLET | Freq: Every day | ORAL | Status: DC

## 2016-09-23 MED ORDER — SIMVASTATIN 40 MG PO TABS: 40.0000 mg | ORAL_TABLET | Freq: Every evening | ORAL | 0 refills | Status: DC

## 2016-09-23 MED ORDER — ASPIRIN EC 325 MG PO TBEC: 325.0000 mg | DELAYED_RELEASE_TABLET | Freq: Every day | ORAL | 0 refills | Status: DC

## 2016-09-23 MED ORDER — ASPIRIN 325 MG PO TABS
325.0000 mg | ORAL_TABLET | Freq: Every day | ORAL | Status: DC
  Administered 2016-09-23: 325 mg via ORAL
  Filled 2016-09-23: qty 1

## 2016-09-23 MED ORDER — ASPIRIN EC 81 MG PO TBEC: 81.0000 mg | DELAYED_RELEASE_TABLET | Freq: Every day | ORAL | Status: DC

## 2016-09-23 NOTE — Progress Notes (Signed)
Pt being discharged from hospital per orders from MD. Pt and family educated on discharge instructions. Pt and family verbalized understanding of instructions. All questions and concerns were addressed. Pt's IV's were removed prior to discharge. Pt exited the hospital via wheelchair.

## 2016-09-23 NOTE — Progress Notes (Signed)
OT Cancellation Note  Patient Details Name: Christine Lyons MRN: [id number] DOB: 01-31-1925   Cancelled Treatment:    Reason Eval/Treat Not Completed: OT screened, no needs identified, will sign off  [facility], OTR/L  [id number] 09/23/2016 09/23/2016, 2:56 PM

## 2016-09-23 NOTE — Discharge Summary (Signed)
Physician Discharge Summary  Christine Lyons  [id number]  DOB: 1925/09/08  DOA: 09/22/2016 PCP: Unice Cobble, MD  Admit date: 09/22/2016 Discharge date: 09/23/2016  Admitted From: Home  Disposition:  Home   Recommendations for Outpatient Follow-up:  1. Follow up with PCP in 1-2 weeks 2. Please obtain BMP/CBC in one week 3. Please schedule ECHO cardiogram as outpatient  4. Please follow up A1C pending results   Home Health: None  Equipment/Devices: None   Discharge Condition: Stable  CODE STATUS: DNR  Diet recommendation: Heart Healthy  Brief/Interim Summary: Christine Lyons is a 83 y.o. female with medical history significant of TIA, HTN, HLD presenting after she went to orthopedics for a knee injection and developed acute aphasia. She was given ASA with significant improvement. Ambulance was called upon there arrival patient had NIH score of 3. Patient had a head CT which was negative, NIH score of 2 upon neurology evaluation on the ED Patient's symptoms fully resolved in about 4 hrs. Patient placed on observation for evaluation on Stroke/TIA.   Subjective: Patient seen and examined. Patient have no complaints doing well. Speech back to normal. Only with mild headache which she attributed to poor sleep.   Discharge Diagnoses:  TIA - acute aphasia, did not received t-PA due to resolution of symptoms  Symptoms fully resolved  Head CT - no cute abnormalities  CTA neck - no significant occlusion/stenosis L ICA 50%  C4-C& mod cervical spondylosis  MRI/MRA - no acute stroke or occlusion  LDL 65 ECHO to be done as an outpatient  A1C pending  Aspirin increased to 325 mg  Follow up with PCP   HTN - slight elevated  Patient was allowed permissive HTN until MRI was resulted, BP meds where re-ordered  Continue Norvasc and Losartan  Follow up with PCP   HLD  Will increase Zocor to 40 mg for secondary prevention  Discharge Instructions  You were cared for by a hospitalist  during your hospital stay. If you have any questions about your discharge medications or the care you received while you were in the hospital after you are discharged, you can call the unit and asked to speak with the hospitalist on call if the hospitalist that took care of you is not available. Once you are discharged, your primary care physician will handle any further medical issues. Please note that NO REFILLS for any discharge medications will be authorized once you are discharged, as it is imperative that you return to your primary care physician (or establish a relationship with a primary care physician if you do not have one) for your aftercare needs so that they can reassess your need for medications and monitor your lab values.  Discharge Instructions    Call MD for:  difficulty breathing, headache or visual disturbances    Complete by:  As directed    Call MD for:  extreme fatigue    Complete by:  As directed    Call MD for:  hives    Complete by:  As directed    Call MD for:  persistant dizziness or light-headedness    Complete by:  As directed    Call MD for:  persistant nausea and vomiting    Complete by:  As directed    Call MD for:  redness, tenderness, or signs of infection (pain, swelling, redness, odor or green/yellow discharge around incision site)    Complete by:  As directed    Call MD for:  severe uncontrolled  pain    Complete by:  As directed    Call MD for:  temperature >100.4    Complete by:  As directed    Diet - low sodium heart healthy    Complete by:  As directed    Discharge instructions    Complete by:  As directed    Increase activity slowly    Complete by:  As directed      Allergies as of 09/23/2016      Reactions   Codeine Nausea Only, Anxiety      Medication List    TAKE these medications   amLODipine 5 MG tablet Commonly known as:  NORVASC Take 5 mg by mouth daily.   aspirin EC 325 MG tablet Take 1 tablet (325 mg total) by mouth  daily. Start taking on:  09/24/2016 What changed:  medication strength  how much to take   B-12 PO Take 1 tablet by mouth daily.   BIOTIN PO Take 1 tablet by mouth daily.   BORON PO Take 1 tablet by mouth daily.   Cholecalciferol 5000 units capsule Take 5,000 Units by mouth daily.   ICAPS PO Take 1 capsule by mouth daily.   losartan 100 MG tablet Commonly known as:  COZAAR APPOINTMENT DUE, 1 by mouth daily What changed:  how much to take  how to take this  when to take this  additional instructions   MAGNESIUM PO Take 1 tablet by mouth daily.   multivitamin with minerals Tabs tablet Take 1 tablet by mouth daily.   SELENIUM PO Take 1 tablet by mouth daily.   simvastatin 40 MG tablet Commonly known as:  ZOCOR Take 1 tablet (40 mg total) by mouth every evening. What changed:  medication strength  how much to take   vitamin C 1000 MG tablet Take 1,000 mg by mouth daily.   VITAMIN K2 PO Take 1 tablet by mouth daily.       Allergies  Allergen Reactions  . Codeine Nausea Only and Anxiety    Consultations:  Neurology    Procedures/Studies: Ct Angio Head W Or Wo Contrast  Result Date: 09/22/2016 CLINICAL DATA:  83 y/o  F; aphasia. EXAM: CT ANGIOGRAPHY HEAD AND NECK TECHNIQUE: Multidetector CT imaging of the head and neck was performed using the standard protocol during bolus administration of intravenous contrast. Multiplanar CT image reconstructions and MIPs were obtained to evaluate the vascular anatomy. Carotid stenosis measurements (when applicable) are obtained utilizing NASCET criteria, using the distal internal carotid diameter as the denominator. CONTRAST:  50 cc Isovue 370 COMPARISON:  None. FINDINGS: CTA NECK FINDINGS Aortic arch: Standard branching. Imaged portion shows no evidence of aneurysm or dissection. Mixed plaque of the proximal left subclavian artery origin with 50% stenosis. Right carotid system: No evidence of dissection,  stenosis (50% or greater) or occlusion. Left carotid system: No evidence of dissection, stenosis (50% or greater) or occlusion. Vertebral arteries: Left dominant. No evidence of dissection, stenosis (50% or greater) or occlusion. Skeleton: Moderate discogenic and facet degenerative changes of the cervical spine greatest from the C4 through C7 levels. Multiple levels of uncovertebral and facet hypertrophy with bony foraminal narrowing from C4 through C7. No high-grade bony canal stenosis. Productive changes of the anterior C1-2 articulation. No acute osseous abnormality is evident. Other neck: Negative. Upper chest: Negative. Review of the MIP images confirms the above findings CTA HEAD FINDINGS Anterior circulation: No significant stenosis, proximal occlusion, aneurysm, or vascular malformation. Moderate calcific atherosclerosis of cavernous and  supraclinoid segments of ICA without significant stenosis. Posterior circulation: No significant stenosis, proximal occlusion, aneurysm, or vascular malformation. Venous sinuses: As permitted by contrast timing, patent. Anatomic variants: Fenestrated anterior communicating artery. Fetal right posterior cerebral artery. No left posterior communicating artery identified. Review of the MIP images confirms the above findings IMPRESSION: 1. No evidence of dissection, hemodynamically significant stenosis, or occlusion of the carotid and vertebral arteries of the neck. 2. No significant stenosis, proximal occlusion, aneurysm, or vascular malformation of the circle of Willis. 3. Left subclavian artery origin mixed plaque with 50% stenosis. 4. Moderate cervical spondylosis greatest from C4 through C7. These results were called by telephone at the time of interpretation on 09/22/2016 at 5:47 pm to Dr. Norman Clay , who verbally acknowledged these results. Electronically Signed   By: Kristine Garbe M.D.   On: 09/22/2016 17:48   Ct Angio Neck W Or Wo Contrast  Result  Date: 09/22/2016 CLINICAL DATA:  83 y/o  F; aphasia. EXAM: CT ANGIOGRAPHY HEAD AND NECK TECHNIQUE: Multidetector CT imaging of the head and neck was performed using the standard protocol during bolus administration of intravenous contrast. Multiplanar CT image reconstructions and MIPs were obtained to evaluate the vascular anatomy. Carotid stenosis measurements (when applicable) are obtained utilizing NASCET criteria, using the distal internal carotid diameter as the denominator. CONTRAST:  50 cc Isovue 370 COMPARISON:  None. FINDINGS: CTA NECK FINDINGS Aortic arch: Standard branching. Imaged portion shows no evidence of aneurysm or dissection. Mixed plaque of the proximal left subclavian artery origin with 50% stenosis. Right carotid system: No evidence of dissection, stenosis (50% or greater) or occlusion. Left carotid system: No evidence of dissection, stenosis (50% or greater) or occlusion. Vertebral arteries: Left dominant. No evidence of dissection, stenosis (50% or greater) or occlusion. Skeleton: Moderate discogenic and facet degenerative changes of the cervical spine greatest from the C4 through C7 levels. Multiple levels of uncovertebral and facet hypertrophy with bony foraminal narrowing from C4 through C7. No high-grade bony canal stenosis. Productive changes of the anterior C1-2 articulation. No acute osseous abnormality is evident. Other neck: Negative. Upper chest: Negative. Review of the MIP images confirms the above findings CTA HEAD FINDINGS Anterior circulation: No significant stenosis, proximal occlusion, aneurysm, or vascular malformation. Moderate calcific atherosclerosis of cavernous and supraclinoid segments of ICA without significant stenosis. Posterior circulation: No significant stenosis, proximal occlusion, aneurysm, or vascular malformation. Venous sinuses: As permitted by contrast timing, patent. Anatomic variants: Fenestrated anterior communicating artery. Fetal right posterior  cerebral artery. No left posterior communicating artery identified. Review of the MIP images confirms the above findings IMPRESSION: 1. No evidence of dissection, hemodynamically significant stenosis, or occlusion of the carotid and vertebral arteries of the neck. 2. No significant stenosis, proximal occlusion, aneurysm, or vascular malformation of the circle of Willis. 3. Left subclavian artery origin mixed plaque with 50% stenosis. 4. Moderate cervical spondylosis greatest from C4 through C7. These results were called by telephone at the time of interpretation on 09/22/2016 at 5:47 pm to Dr. Norman Clay , who verbally acknowledged these results. Electronically Signed   By: Kristine Garbe M.D.   On: 09/22/2016 17:48   Mr Brain Wo Contrast  Result Date: 09/23/2016 CLINICAL DATA:  Acute aphasia. EXAM: MRI HEAD WITHOUT CONTRAST MRA HEAD WITHOUT CONTRAST TECHNIQUE: Multiplanar, multiecho pulse sequences of the brain and surrounding structures were obtained without intravenous contrast. Angiographic images of the head were obtained using MRA technique without contrast. COMPARISON:  Head CT 09/22/2016 FINDINGS: MRI HEAD FINDINGS Brain:  No focal diffusion restriction to indicate acute infarct. No intraparenchymal hemorrhage. There is multifocal hyperintense T2-weighted signal within the periventricular white matter, most often seen in the setting of chronic microvascular ischemia. No mass lesion or midline shift. No hydrocephalus or extra-axial fluid collection. The midline structures are normal. There is generalized volume loss without lobar predilection. Vascular: Major intracranial arterial and venous sinus flow voids are preserved. No evidence of chronic microhemorrhage or amyloid angiopathy. Skull and upper cervical spine: The visualized skull base, calvarium, upper cervical spine and extracranial soft tissues are normal. Sinuses/Orbits: No fluid levels or advanced mucosal thickening. No mastoid  effusion. Normal orbits. MRA HEAD FINDINGS Intracranial internal carotid arteries: Normal. Anterior cerebral arteries: Normal. Middle cerebral arteries: Normal. Posterior communicating arteries: Present on the right. Posterior cerebral arteries: Fetal origin of the right PCA. Normal left PCA. Basilar artery: Normal. Vertebral arteries: Left dominant. Normal. Superior cerebellar arteries: Distal right superior cerebellar artery not clearly visualized. Normal left. Anterior inferior cerebellar arteries: Not clearly visualized, which is not uncommon. Posterior inferior cerebellar arteries: Normal. IMPRESSION: 1. No acute intracranial abnormality. 2. Generalized volume loss and mild chronic microvascular ischemia. 3. No intracranial occlusion or high-grade stenosis. Electronically Signed   By: Ulyses Jarred M.D.   On: 09/23/2016 03:58   Mr Jodene Nam Head/brain [id number] Cm  Result Date: 09/23/2016 CLINICAL DATA:  Acute aphasia. EXAM: MRI HEAD WITHOUT CONTRAST MRA HEAD WITHOUT CONTRAST TECHNIQUE: Multiplanar, multiecho pulse sequences of the brain and surrounding structures were obtained without intravenous contrast. Angiographic images of the head were obtained using MRA technique without contrast. COMPARISON:  Head CT 09/22/2016 FINDINGS: MRI HEAD FINDINGS Brain: No focal diffusion restriction to indicate acute infarct. No intraparenchymal hemorrhage. There is multifocal hyperintense T2-weighted signal within the periventricular white matter, most often seen in the setting of chronic microvascular ischemia. No mass lesion or midline shift. No hydrocephalus or extra-axial fluid collection. The midline structures are normal. There is generalized volume loss without lobar predilection. Vascular: Major intracranial arterial and venous sinus flow voids are preserved. No evidence of chronic microhemorrhage or amyloid angiopathy. Skull and upper cervical spine: The visualized skull base, calvarium, upper cervical spine and  extracranial soft tissues are normal. Sinuses/Orbits: No fluid levels or advanced mucosal thickening. No mastoid effusion. Normal orbits. MRA HEAD FINDINGS Intracranial internal carotid arteries: Normal. Anterior cerebral arteries: Normal. Middle cerebral arteries: Normal. Posterior communicating arteries: Present on the right. Posterior cerebral arteries: Fetal origin of the right PCA. Normal left PCA. Basilar artery: Normal. Vertebral arteries: Left dominant. Normal. Superior cerebellar arteries: Distal right superior cerebellar artery not clearly visualized. Normal left. Anterior inferior cerebellar arteries: Not clearly visualized, which is not uncommon. Posterior inferior cerebellar arteries: Normal. IMPRESSION: 1. No acute intracranial abnormality. 2. Generalized volume loss and mild chronic microvascular ischemia. 3. No intracranial occlusion or high-grade stenosis. Electronically Signed   By: Ulyses Jarred M.D.   On: 09/23/2016 03:58   Ct Head Code Stroke W/o Cm  Result Date: 09/22/2016 CLINICAL DATA:  Code stroke.  Aphasia. EXAM: CT HEAD WITHOUT CONTRAST TECHNIQUE: Contiguous axial images were obtained from the base of the skull through the vertex without intravenous contrast. COMPARISON:  11/16/2006 CT head.  11/17/2006 MRI head. FINDINGS: Brain: No evidence of acute infarction, hemorrhage, extra-axial collection or mass lesion/mass effect. Moderate brain parenchymal volume loss is progressed from 2008. Lateral and third ventriculomegaly is borderline for the degree of volume loss. Vascular: No hyperdense vessel. Calcific atherosclerosis of cavernous internal carotid arteries. Skull: Normal. Negative for fracture or  focal lesion. Sinuses/Orbits: No acute finding. Other: None. ASPECTS [facility] Stroke Program Early CT Score) - Ganglionic level infarction (caudate, lentiform nuclei, internal capsule, insula, M1-M3 cortex): 7 - Supraganglionic infarction (M4-M6 cortex): 3 Total score (0-10 with 10 being  normal): 10 IMPRESSION: 1. No acute intracranial abnormality identified. If symptoms persist or if clinically indicated MRI is more sensitive for acute stroke. 2. ASPECTS is 10 3. Moderate parenchymal volume loss of the brain is progressed from 2008. Electronically Signed   By: Kristine Garbe M.D.   On: 09/22/2016 17:07     Discharge Exam: Vitals:   09/23/16 0645 09/23/16 0915  BP: (!) 145/51 (!) 146/66  Pulse: 65 66  Resp: 16 17  Temp: 98 F (36.7 C)    Vitals:   09/23/16 0245 09/23/16 0450 09/23/16 0645 09/23/16 0915  BP: (!) 136/49 (!) 156/70 (!) 145/51 (!) 146/66  Pulse: 60 60 65 66  Resp: 18 18 16 17   Temp: 97.8 F (36.6 C) 98.2 F (36.8 C) 98 F (36.7 C)   TempSrc: Oral Oral Oral   SpO2: 95% 97% 94% 96%  Weight:      Height:        General: Pt is alert, awake, not in acute distress Cardiovascular: RRR, S1/S2 +, no rubs, no gallops Respiratory: CTA bilaterally, no wheezing, no rhonchi Abdominal: Soft, NT, ND, bowel sounds + Extremities: no edema, no cyanosis Neurology: No focal deficits     The results of significant diagnostics from this hospitalization (including imaging, microbiology, ancillary and laboratory) are listed below for reference.     Microbiology: Recent Results (from the past 240 hour(s))  MRSA PCR Screening     Status: None   Collection Time: 09/23/16  2:56 AM  Result Value Ref Range Status   MRSA by PCR NEGATIVE NEGATIVE Final    Comment:        The GeneXpert MRSA Assay (FDA approved for NASAL specimens only), is one component of a comprehensive MRSA colonization surveillance program. It is not intended to diagnose MRSA infection nor to guide or monitor treatment for MRSA infections.      Labs: BNP (last 3 results) No results for input(s): BNP in the last 8760 hours. Basic Metabolic Panel:  Recent Labs Lab 09/22/16 1650 09/22/16 1655  NA 140 142  K 4.1 4.2  CL 108 109  CO2 22  --   GLUCOSE 107* 104*  BUN 19 25*   CREATININE 1.01* 1.10*  CALCIUM 9.4  --    Liver Function Tests:  Recent Labs Lab 09/22/16 1650  AST 19  ALT 9*  ALKPHOS 45  BILITOT 0.6  PROT 6.6  ALBUMIN 4.1   No results for input(s): LIPASE, AMYLASE in the last 168 hours. No results for input(s): AMMONIA in the last 168 hours. CBC:  Recent Labs Lab 09/22/16 1650 09/22/16 1655  WBC 10.1  --   NEUTROABS 5.9  --   HGB 12.3 12.9  HCT 37.1 38.0  MCV 99.7  --   PLT 245  --    Cardiac Enzymes:  Recent Labs Lab 09/22/16 2127 09/23/16 0521 09/23/16 0930  TROPONINI <0.03 <0.03 <0.03   BNP: Invalid input(s): POCBNP CBG:  Recent Labs Lab 09/22/16 1649  GLUCAP 103*   D-Dimer No results for input(s): DDIMER in the last 72 hours. Hgb A1c No results for input(s): HGBA1C in the last 72 hours. Lipid Profile  Recent Labs  09/23/16 0521  CHOL 137  HDL 58  LDLCALC 65  TRIG  71  CHOLHDL 2.4   Thyroid function studies  Recent Labs  09/23/16 0521  TSH 2.185   Anemia work up No results for input(s): VITAMINB12, FOLATE, FERRITIN, TIBC, IRON, RETICCTPCT in the last 72 hours. Urinalysis    Component Value Date/Time   COLORURINE YELLOW 08/28/2012 [place] 08/28/2012 1453   LABSPEC 1.024 08/28/2012 1453   PHURINE 6.0 08/28/2012 1453   GLUCOSEU NEGATIVE 08/28/2012 1453   HGBUR NEGATIVE 08/28/2012 1453   BILIRUBINUR NEGATIVE 08/28/2012 1453   KETONESUR 40 (A) 08/28/2012 1453   PROTEINUR NEGATIVE 08/28/2012 1453   UROBILINOGEN 0.2 08/28/2012 1453   NITRITE NEGATIVE 08/28/2012 1453   LEUKOCYTESUR NEGATIVE 08/28/2012 1453   Sepsis Labs Invalid input(s): PROCALCITONIN,  WBC,  LACTICIDVEN Microbiology Recent Results (from the past 240 hour(s))  MRSA PCR Screening     Status: None   Collection Time: 09/23/16  2:56 AM  Result Value Ref Range Status   MRSA by PCR NEGATIVE NEGATIVE Final    Comment:        The GeneXpert MRSA Assay (FDA approved for NASAL specimens only), is one component  of a comprehensive MRSA colonization surveillance program. It is not intended to diagnose MRSA infection nor to guide or monitor treatment for MRSA infections.      Time coordinating discharge: Over 30 minutes  SIGNED:  Chipper Oman, MD  [facility] 09/23/2016, 1:43 PM Pager   If 7PM-7AM, please contact night-coverage [email or web address] Password [email or web address]

## 2016-09-23 NOTE — Progress Notes (Signed)
Nutrition Brief Note  Nutrition Consult received for TIA.   Wt Readings from Last 15 Encounters:  09/22/16 114 lb 1.6 oz (51.8 kg)  12/23/15 124 lb (56.2 kg)  10/09/12 124 lb (56.2 kg)  08/28/12 142 lb 13.7 oz (64.8 kg)  11/30/11 131 lb (59.4 kg)  10/20/10 138 lb (62.6 kg)  03/23/10 141 lb 12.8 oz (64.3 kg)  08/27/09 143 lb (64.9 kg)    Body mass index is 21.56 kg/m. Patient meets criteria for Normal Weight based on current BMI.   Current diet order is Heart Healthy, patient is consuming approximately 25% of meals at this time. Pt states that she has been eating less than usual causing a 20 lbs weight loss in he past 6 weeks. She states that she usually has a poor appetite at breakfast and eats better at lunch/dinner. Pt denies any nutrition needs at this time as she expects to be discharged today. Labs and medications reviewed.   RD recommended using nutritional supplements such as Boost or Ensure to supplement diet as needed in hopes of preventing further weight loss. Pt states that her husband used to drink Ensure and can give it a try. No further nutrition interventions warranted at this time.   Scarlette Ar RD, CSP, LDN Inpatient Clinical Dietitian Pager: [phone] After Hours Pager: [phone]

## 2016-09-23 NOTE — Progress Notes (Signed)
VASCULAR LAB    Patient had normal CTA of neck. Please advise if carotid duplex still needed.     Thank you,  Alya Smaltz, RVT 09/23/2016, 11:05 AM

## 2016-09-23 NOTE — Procedures (Signed)
EEG Report  Referring physician- Harlene Salts  History/indication- 49 female wit episode of aphasia  Hospital Medications  0.9 % sodium chloride infusion   acetaminophen (TYLENOL) solution 650 mg    amLODipine (NORVASC) tablet 5 mg   aspirin EC tablet 81 mg   enoxaparin (LOVENOX) injection 30 mg  Technical This is a multichannel digital EEG recording using the international 10-20 placement system.  Description of the Recording Posterior dominant background is 8-9hz  Generalize slowing during drowsiness Focal slowing or epileptiform features not see during the this recording  Impression The EEG is normal with patient recorded in awake and drowsy states.

## 2016-09-23 NOTE — Progress Notes (Signed)
SLP Cancellation Note  Patient Details Name: Christine Lyons MRN: [id number] DOB: 1925-04-20   Cancelled treatment:       Reason Eval/Treat Not Completed: SLP screened, no needs identified, will sign off. Chatted with pt at length. She does not desire evaluation or SLP f/u and no acute needs identified. Will sign off.    Tamekia Rotter, Katherene Ponto 09/23/2016, 10:11 AM

## 2016-09-23 NOTE — Progress Notes (Signed)
STROKE TEAM PROGRESS NOTE   HISTORY OF PRESENT ILLNESS (per record) Christine Lyons is an 83 y.o. female presents from her orthopedic appointment for sudden onset of expressive aphasia at 4pm on 09/22/16. Patient was not administered IV t-PA secondary to resolving symptoms and now NIHSS 2. She was admitted for further evaluation and treatment.   SUBJECTIVE (INTERVAL HISTORY) Her daughter is at the bedside. She is up in the chair and anxious to go home. They recounted her 1.5 hr episode of aphasia that went from stuttering to aphasia and back to normal. She had a similar episode in 1999 in [place]. They called it a TIA at that time.  No other episodes recalled. She reports a HA yesterday.  Overall she feels her condition is completely resolved.    OBJECTIVE Temp:  [97.8 F (36.6 C)-98.3 F (36.8 C)] 98 F (36.7 C) (01/11 0645) Pulse Rate:  [60-83] 66 (01/11 0915) Cardiac Rhythm: Sinus bradycardia (01/11 0700) Resp:  [9-21] 17 (01/11 0915) BP: (136-175)/(49-93) 146/66 (01/11 0915) SpO2:  [92 %-100 %] 96 % (01/11 0915) Weight:  [51.8 kg (114 lb 1.6 oz)-57.1 kg (125 lb 14.1 oz)] 51.8 kg (114 lb 1.6 oz) (01/10 2048)  CBC:   Recent Labs Lab 09/22/16 1650 09/22/16 1655  WBC 10.1  --   NEUTROABS 5.9  --   HGB 12.3 12.9  HCT 37.1 38.0  MCV 99.7  --   PLT 245  --     Basic Metabolic Panel:   Recent Labs Lab 09/22/16 1650 09/22/16 1655  NA 140 142  K 4.1 4.2  CL 108 109  CO2 22  --   GLUCOSE 107* 104*  BUN 19 25*  CREATININE 1.01* 1.10*  CALCIUM 9.4  --     Lipid Panel:     Component Value Date/Time   CHOL 137 09/23/2016 0521   TRIG 71 09/23/2016 0521   HDL 58 09/23/2016 0521   CHOLHDL 2.4 09/23/2016 0521   VLDL 14 09/23/2016 0521   LDLCALC 65 09/23/2016 0521   HgbA1c:  Lab Results  Component Value Date   HGBA1C 5.8 10/21/2010   Urine Drug Screen: No results found for: LABOPIA, COCAINSCRNUR, LABBENZ, AMPHETMU, THCU, LABBARB    IMAGING  Ct Angio Head W Or Wo  Contrast  Result Date: 09/22/2016 CLINICAL DATA:  83 y/o  F; aphasia. EXAM: CT ANGIOGRAPHY HEAD AND NECK TECHNIQUE: Multidetector CT imaging of the head and neck was performed using the standard protocol during bolus administration of intravenous contrast. Multiplanar CT image reconstructions and MIPs were obtained to evaluate the vascular anatomy. Carotid stenosis measurements (when applicable) are obtained utilizing NASCET criteria, using the distal internal carotid diameter as the denominator. CONTRAST:  50 cc Isovue 370 COMPARISON:  None. FINDINGS: CTA NECK FINDINGS Aortic arch: Standard branching. Imaged portion shows no evidence of aneurysm or dissection. Mixed plaque of the proximal left subclavian artery origin with 50% stenosis. Right carotid system: No evidence of dissection, stenosis (50% or greater) or occlusion. Left carotid system: No evidence of dissection, stenosis (50% or greater) or occlusion. Vertebral arteries: Left dominant. No evidence of dissection, stenosis (50% or greater) or occlusion. Skeleton: Moderate discogenic and facet degenerative changes of the cervical spine greatest from the C4 through C7 levels. Multiple levels of uncovertebral and facet hypertrophy with bony foraminal narrowing from C4 through C7. No high-grade bony canal stenosis. Productive changes of the anterior C1-2 articulation. No acute osseous abnormality is evident. Other neck: Negative. Upper chest: Negative. Review of the  MIP images confirms the above findings CTA HEAD FINDINGS Anterior circulation: No significant stenosis, proximal occlusion, aneurysm, or vascular malformation. Moderate calcific atherosclerosis of cavernous and supraclinoid segments of ICA without significant stenosis. Posterior circulation: No significant stenosis, proximal occlusion, aneurysm, or vascular malformation. Venous sinuses: As permitted by contrast timing, patent. Anatomic variants: Fenestrated anterior communicating artery. Fetal  right posterior cerebral artery. No left posterior communicating artery identified. Review of the MIP images confirms the above findings IMPRESSION: 1. No evidence of dissection, hemodynamically significant stenosis, or occlusion of the carotid and vertebral arteries of the neck. 2. No significant stenosis, proximal occlusion, aneurysm, or vascular malformation of the circle of Willis. 3. Left subclavian artery origin mixed plaque with 50% stenosis. 4. Moderate cervical spondylosis greatest from C4 through C7. These results were called by telephone at the time of interpretation on 09/22/2016 at 5:47 pm to Dr. Norman Clay , who verbally acknowledged these results. Electronically Signed   By: Kristine Garbe M.D.   On: 09/22/2016 17:48   Ct Angio Neck W Or Wo Contrast  Result Date: 09/22/2016 CLINICAL DATA:  83 y/o  F; aphasia. EXAM: CT ANGIOGRAPHY HEAD AND NECK TECHNIQUE: Multidetector CT imaging of the head and neck was performed using the standard protocol during bolus administration of intravenous contrast. Multiplanar CT image reconstructions and MIPs were obtained to evaluate the vascular anatomy. Carotid stenosis measurements (when applicable) are obtained utilizing NASCET criteria, using the distal internal carotid diameter as the denominator. CONTRAST:  50 cc Isovue 370 COMPARISON:  None. FINDINGS: CTA NECK FINDINGS Aortic arch: Standard branching. Imaged portion shows no evidence of aneurysm or dissection. Mixed plaque of the proximal left subclavian artery origin with 50% stenosis. Right carotid system: No evidence of dissection, stenosis (50% or greater) or occlusion. Left carotid system: No evidence of dissection, stenosis (50% or greater) or occlusion. Vertebral arteries: Left dominant. No evidence of dissection, stenosis (50% or greater) or occlusion. Skeleton: Moderate discogenic and facet degenerative changes of the cervical spine greatest from the C4 through C7 levels. Multiple  levels of uncovertebral and facet hypertrophy with bony foraminal narrowing from C4 through C7. No high-grade bony canal stenosis. Productive changes of the anterior C1-2 articulation. No acute osseous abnormality is evident. Other neck: Negative. Upper chest: Negative. Review of the MIP images confirms the above findings CTA HEAD FINDINGS Anterior circulation: No significant stenosis, proximal occlusion, aneurysm, or vascular malformation. Moderate calcific atherosclerosis of cavernous and supraclinoid segments of ICA without significant stenosis. Posterior circulation: No significant stenosis, proximal occlusion, aneurysm, or vascular malformation. Venous sinuses: As permitted by contrast timing, patent. Anatomic variants: Fenestrated anterior communicating artery. Fetal right posterior cerebral artery. No left posterior communicating artery identified. Review of the MIP images confirms the above findings IMPRESSION: 1. No evidence of dissection, hemodynamically significant stenosis, or occlusion of the carotid and vertebral arteries of the neck. 2. No significant stenosis, proximal occlusion, aneurysm, or vascular malformation of the circle of Willis. 3. Left subclavian artery origin mixed plaque with 50% stenosis. 4. Moderate cervical spondylosis greatest from C4 through C7. These results were called by telephone at the time of interpretation on 09/22/2016 at 5:47 pm to Dr. Norman Clay , who verbally acknowledged these results. Electronically Signed   By: Kristine Garbe M.D.   On: 09/22/2016 17:48   Mr Brain Wo Contrast  Result Date: 09/23/2016 CLINICAL DATA:  Acute aphasia. EXAM: MRI HEAD WITHOUT CONTRAST MRA HEAD WITHOUT CONTRAST TECHNIQUE: Multiplanar, multiecho pulse sequences of the brain and surrounding structures were  obtained without intravenous contrast. Angiographic images of the head were obtained using MRA technique without contrast. COMPARISON:  Head CT 09/22/2016 FINDINGS: MRI  HEAD FINDINGS Brain: No focal diffusion restriction to indicate acute infarct. No intraparenchymal hemorrhage. There is multifocal hyperintense T2-weighted signal within the periventricular white matter, most often seen in the setting of chronic microvascular ischemia. No mass lesion or midline shift. No hydrocephalus or extra-axial fluid collection. The midline structures are normal. There is generalized volume loss without lobar predilection. Vascular: Major intracranial arterial and venous sinus flow voids are preserved. No evidence of chronic microhemorrhage or amyloid angiopathy. Skull and upper cervical spine: The visualized skull base, calvarium, upper cervical spine and extracranial soft tissues are normal. Sinuses/Orbits: No fluid levels or advanced mucosal thickening. No mastoid effusion. Normal orbits. MRA HEAD FINDINGS Intracranial internal carotid arteries: Normal. Anterior cerebral arteries: Normal. Middle cerebral arteries: Normal. Posterior communicating arteries: Present on the right. Posterior cerebral arteries: Fetal origin of the right PCA. Normal left PCA. Basilar artery: Normal. Vertebral arteries: Left dominant. Normal. Superior cerebellar arteries: Distal right superior cerebellar artery not clearly visualized. Normal left. Anterior inferior cerebellar arteries: Not clearly visualized, which is not uncommon. Posterior inferior cerebellar arteries: Normal. IMPRESSION: 1. No acute intracranial abnormality. 2. Generalized volume loss and mild chronic microvascular ischemia. 3. No intracranial occlusion or high-grade stenosis. Electronically Signed   By: Ulyses Jarred M.D.   On: 09/23/2016 03:58   Mr Jodene Nam Head/brain [id number] Cm  Result Date: 09/23/2016 CLINICAL DATA:  Acute aphasia. EXAM: MRI HEAD WITHOUT CONTRAST MRA HEAD WITHOUT CONTRAST TECHNIQUE: Multiplanar, multiecho pulse sequences of the brain and surrounding structures were obtained without intravenous contrast. Angiographic images of the  head were obtained using MRA technique without contrast. COMPARISON:  Head CT 09/22/2016 FINDINGS: MRI HEAD FINDINGS Brain: No focal diffusion restriction to indicate acute infarct. No intraparenchymal hemorrhage. There is multifocal hyperintense T2-weighted signal within the periventricular white matter, most often seen in the setting of chronic microvascular ischemia. No mass lesion or midline shift. No hydrocephalus or extra-axial fluid collection. The midline structures are normal. There is generalized volume loss without lobar predilection. Vascular: Major intracranial arterial and venous sinus flow voids are preserved. No evidence of chronic microhemorrhage or amyloid angiopathy. Skull and upper cervical spine: The visualized skull base, calvarium, upper cervical spine and extracranial soft tissues are normal. Sinuses/Orbits: No fluid levels or advanced mucosal thickening. No mastoid effusion. Normal orbits. MRA HEAD FINDINGS Intracranial internal carotid arteries: Normal. Anterior cerebral arteries: Normal. Middle cerebral arteries: Normal. Posterior communicating arteries: Present on the right. Posterior cerebral arteries: Fetal origin of the right PCA. Normal left PCA. Basilar artery: Normal. Vertebral arteries: Left dominant. Normal. Superior cerebellar arteries: Distal right superior cerebellar artery not clearly visualized. Normal left. Anterior inferior cerebellar arteries: Not clearly visualized, which is not uncommon. Posterior inferior cerebellar arteries: Normal. IMPRESSION: 1. No acute intracranial abnormality. 2. Generalized volume loss and mild chronic microvascular ischemia. 3. No intracranial occlusion or high-grade stenosis. Electronically Signed   By: Ulyses Jarred M.D.   On: 09/23/2016 03:58   Ct Head Code Stroke W/o Cm  Result Date: 09/22/2016 CLINICAL DATA:  Code stroke.  Aphasia. EXAM: CT HEAD WITHOUT CONTRAST TECHNIQUE: Contiguous axial images were obtained from the base of the skull  through the vertex without intravenous contrast. COMPARISON:  11/16/2006 CT head.  11/17/2006 MRI head. FINDINGS: Brain: No evidence of acute infarction, hemorrhage, extra-axial collection or mass lesion/mass effect. Moderate brain parenchymal volume loss is progressed from 2008. Lateral and  third ventriculomegaly is borderline for the degree of volume loss. Vascular: No hyperdense vessel. Calcific atherosclerosis of cavernous internal carotid arteries. Skull: Normal. Negative for fracture or focal lesion. Sinuses/Orbits: No acute finding. Other: None. ASPECTS [facility] Stroke Program Early CT Score) - Ganglionic level infarction (caudate, lentiform nuclei, internal capsule, insula, M1-M3 cortex): 7 - Supraganglionic infarction (M4-M6 cortex): 3 Total score (0-10 with 10 being normal): 10 IMPRESSION: 1. No acute intracranial abnormality identified. If symptoms persist or if clinically indicated MRI is more sensitive for acute stroke. 2. ASPECTS is 10 3. Moderate parenchymal volume loss of the brain is progressed from 2008. Electronically Signed   By: Kristine Garbe M.D.   On: 09/22/2016 17:07    PHYSICAL EXAM Pleasant elderly caucasian lady not in distress. . Afebrile. Head is nontraumatic. Neck is supple without bruit.    Cardiac exam no murmur or gallop. Lungs are clear to auscultation. Distal pulses are well felt. Neurological Exam ;  Awake  Alert oriented x 3. Normal speech and language.eye movements full without nystagmus.fundi were not visualized. Vision acuity and fields appear normal. Hearing is normal. Palatal movements are normal. Face symmetric. Tongue midline. Normal strength, tone, reflexes and coordination. Normal sensation. Gait deferred.  ASSESSMENT/PLAN Christine Lyons is a 83 y.o. female with history of HTN, HLD, stroke, GERD bradycardia, L hip fx, osteopenia presenting with expressive aphasia. She did not receive IV t-PA due to resolving symptoms.   Possible TIA vs partial  seizure (given previous similar episode) or migraine  CT no acute abnormality. ASPECTS 10  CTA head/neck no significant occlusion/stenosis/dissection/aneurysm.  L ICA 50%. C4-C7 mod cervical spndylosis  MRI  No acute stroke  MRA  No occlusion/stenosis  2D Echo  pending   Check EEG  LDL 65  HgbA1c pending  Lovenox 30 mg sq daily for VTE prophylaxis Diet Heart Room service appropriate? Yes; Fluid consistency: Thin  No antithrombotic (has not been on plavix for a couple of years, not on aspirin)  prior to admission, now on aspirin 325 mg daily . Ok to continue aspirin 81 mg daily for prevention. dose changed.  Patient counseled to be compliant with her antithrombotic medications  Ongoing aggressive stroke risk factor management  Therapy recommendations:  No therapy needs  Patient instructed to stay hydrated  Disposition:  Return home  [facility] for discharge once EEG completed from stroke standpoint  followup with neurology in 6 weeks  Hypertension  Stable  Permissive hypertension (OK if < 220/120) but gradually normalize in 5-7 days  Long-term BP goal normotensive  Hyperlipidemia  Home meds:  zocor 20, resumed in hospital  LDL 65, goal < 70  Continue statin at discharge  Other Stroke Risk Factors  Advanced age  ETOH use, advised to drink no more than 1 drink(s) a day  Hx stroke/TIA  69 - in [facility], expressive aphasia, similar to presentation yesterday  Hospital day # 0  Radene Journey [facility] for Pager information 09/23/2016 1:38 PM  I have personally examined this patient, reviewed notes, independently viewed imaging studies, participated in medical decision making and plan of care.ROS completed by me personally and pertinent positives fully documented  I have made any additions or clarifications directly to the above note. Agree with note above. She presented with episode of transient speech difficulties followed by mild headache.  Possibilities include left hemispheric TIA versus Complicated migraine. Simple partial seizure less likely. Start aspirin 81 mg daily. Recommend check EEG. May be discharged after  EEG and follow-up as an outpatient in stroke clinic in 6 weeks. Greater than 50% time during this 25 minute visit was spent on counseling and coordination of care.computed migraine versus seizure versus TIA  Antony Contras, MD Medical Director [facility] Pager: [phone] 09/23/2016 4:42 PM  To contact Stroke Continuity provider, please refer to [email or web address] After hours, contact General Neurology

## 2016-09-23 NOTE — Care Management Obs Status (Signed)
[facility] NOTIFICATION   Patient Details  Name: Christine Lyons MRN: [id number] Date of Birth: 09/24/1924   Medicare Observation Status Notification Given:  Yes (MRI negative)    Pollie Friar, RN 09/23/2016, 11:57 AM

## 2016-09-23 NOTE — Progress Notes (Signed)
EEG Completed; Results Pending  

## 2016-09-23 NOTE — Evaluation (Signed)
Physical Therapy Evaluation Patient Details Name: Christine Lyons MRN: [id number] DOB: June 26, 1925 Today's Date: 09/23/2016   History of Present Illness  Pt is a 83 y/o female who presents s/p episode of aphasia and mild weakness while in the orthopedic office. Per chart review, almost resolved upon presentation to ED. MRI negative for acute abnormalities.   Clinical Impression  Pt admitted with above diagnosis. Pt currently with functional limitations due to the deficits listed below (see PT Problem List). At the time of PT eval pt was able to perform transfers and ambulation with hands-on guarding for safety. Heavy furniture walking was observed during session. Pt reports that she does not need physical therapy services at d/c as she can go to exercise classes provided by [facility]. As pt has had falls at home and appeared unsafe with ambulation during evaluation, feel she could benefit more from physical therapy,then generalized exercise classes. Will recommend HHPT in case pt changes her mind. Pt will benefit from skilled PT to increase their independence and safety with mobility to allow discharge to the venue listed below.       Follow Up Recommendations Home health PT    Equipment Recommendations  None recommended by PT    Recommendations for Other Services       Precautions / Restrictions Precautions Precautions: Fall Restrictions Weight Bearing Restrictions: No      Mobility  Bed Mobility               General bed mobility comments: Pt sitting up in recliner upon PT arrival.   Transfers Overall transfer level: Needs assistance Equipment used: None Transfers: Sit to/from Stand Sit to Stand: Supervision         General transfer comment: Close supervision for safety. No unsteadiness noted however increased time required to achieve full stand.   Ambulation/Gait Ambulation/Gait assistance: Min guard Ambulation Distance (Feet): 30 Feet (5', 10', 10', 5') Assistive  device: None Gait Pattern/deviations: Step-through pattern;Decreased stride length;Trunk flexed;Narrow base of support Gait velocity: Decreased Gait velocity interpretation: Below normal speed for age/gender General Gait Details: Slow gait with flexed posture. Leaning to hold onto furniture in room and hands-on guarding was provided for safety. Pt declines use of RW or HHA for support. Wants to be independent. Pt was educated on risk for falls.   Stairs            Wheelchair Mobility    Modified Rankin (Stroke Patients Only)       Balance Overall balance assessment: Needs assistance Sitting-balance support: Feet supported;No upper extremity supported Sitting balance-Leahy Scale: Good     Standing balance support: No upper extremity supported;During functional activity Standing balance-Leahy Scale: Poor Standing balance comment: Reaching for furniture during ambulation                             Pertinent Vitals/Pain Pain Assessment: No/denies pain    Home Living Family/patient expects to be discharged to:: Assisted living                 Additional Comments: Pt currently residing in the independent living section of [facility].     Prior Function Level of Independence: Independent with assistive device(s)         Comments: Uses rollator when outside - furniture walks when inside     Hand Dominance   Dominant Hand: Left    Extremity/Trunk Assessment   Upper Extremity Assessment Upper Extremity Assessment: Defer to OT  evaluation    Lower Extremity Assessment Lower Extremity Assessment: Generalized weakness    Cervical / Trunk Assessment Cervical / Trunk Assessment: Kyphotic  Communication   Communication: HOH  Cognition Arousal/Alertness: Awake/alert Behavior During Therapy: WFL for tasks assessed/performed Overall Cognitive Status: No family/caregiver present to determine baseline cognitive functioning                  General Comments: Likely baseline     General Comments      Exercises     Assessment/Plan    PT Assessment Patient needs continued PT services  PT Problem List Decreased strength;Decreased range of motion;Decreased activity tolerance;Decreased balance;Decreased mobility;Decreased knowledge of use of DME;Decreased safety awareness;Decreased knowledge of precautions;Pain          PT Treatment Interventions DME instruction;Gait training;Stair training;Functional mobility training;Therapeutic activities;Therapeutic exercise;Neuromuscular re-education;Patient/family education    PT Goals (Current goals can be found in the Care Plan section)  Acute Rehab PT Goals Patient Stated Goal: Back to independent living PT Goal Formulation: With patient Time For Goal Achievement: 09/30/16 Potential to Achieve Goals: Good    Frequency Min 3X/week   Barriers to discharge        Co-evaluation               End of Session Equipment Utilized During Treatment: Gait belt Activity Tolerance: Patient tolerated treatment well Patient left: in chair;with call bell/phone within reach;with chair alarm set Nurse Communication: Mobility status    Functional Assessment Tool Used: Clinical judgement Functional Limitation: Mobility: Walking and moving around Mobility: Walking and Moving Around Current Status [phone]): At least 20 percent but less than 40 percent impaired, limited or restricted Mobility: Walking and Moving Around Goal Status [phone]): At least 1 percent but less than 20 percent impaired, limited or restricted    Time: 1100-1118 PT Time Calculation (min) (ACUTE ONLY): 18 min   Charges:   PT Evaluation $PT Eval Moderate Complexity: 1 Procedure     PT G Codes:   PT G-Codes **NOT FOR INPATIENT CLASS** Functional Assessment Tool Used: Clinical judgement Functional Limitation: Mobility: Walking and moving around Mobility: Walking and Moving Around Current Status [id number]): At  least 20 percent but less than 40 percent impaired, limited or restricted Mobility: Walking and Moving Around Goal Status [phone]): At least 1 percent but less than 20 percent impaired, limited or restricted    Thelma Comp 09/23/2016, 1:47 PM   Rolinda Roan, PT, DPT Acute Rehabilitation Services Pager: [phone]

## 2016-09-23 NOTE — Progress Notes (Signed)
  Echocardiogram 2D Echocardiogram has been performed.  Jennette Dubin 09/23/2016, 3:59 PM

## 2017-01-14 ENCOUNTER — Inpatient Hospital Stay (HOSPITAL_COMMUNITY)
Admission: EM | Admit: 2017-01-14 | Discharge: 2017-01-16 | DRG: 069 | Disposition: A | Discharge status: Home / Self Care | Payer: Medicare Other | Attending: Family Medicine | Admitting: Family Medicine

## 2017-01-14 ENCOUNTER — Emergency Department (HOSPITAL_COMMUNITY): Payer: Medicare Other

## 2017-01-14 ENCOUNTER — Encounter (HOSPITAL_COMMUNITY): Payer: Self-pay | Admitting: Emergency Medicine

## 2017-01-14 DIAGNOSIS — Z8249 Family history of ischemic heart disease and other diseases of the circulatory system: Secondary | ICD-10-CM

## 2017-01-14 DIAGNOSIS — Z8673 Personal history of transient ischemic attack (TIA), and cerebral infarction without residual deficits: Secondary | ICD-10-CM

## 2017-01-14 DIAGNOSIS — R4182 Altered mental status, unspecified: Secondary | ICD-10-CM

## 2017-01-14 DIAGNOSIS — M8580 Other specified disorders of bone density and structure, unspecified site: Secondary | ICD-10-CM | POA: Diagnosis present

## 2017-01-14 DIAGNOSIS — Z885 Allergy status to narcotic agent status: Secondary | ICD-10-CM

## 2017-01-14 DIAGNOSIS — K219 Gastro-esophageal reflux disease without esophagitis: Secondary | ICD-10-CM | POA: Diagnosis present

## 2017-01-14 DIAGNOSIS — R29703 NIHSS score 3: Secondary | ICD-10-CM | POA: Diagnosis present

## 2017-01-14 DIAGNOSIS — G459 Transient cerebral ischemic attack, unspecified: Secondary | ICD-10-CM | POA: Diagnosis present

## 2017-01-14 DIAGNOSIS — R748 Abnormal levels of other serum enzymes: Secondary | ICD-10-CM

## 2017-01-14 DIAGNOSIS — R509 Fever, unspecified: Secondary | ICD-10-CM

## 2017-01-14 DIAGNOSIS — G43909 Migraine, unspecified, not intractable, without status migrainosus: Secondary | ICD-10-CM | POA: Diagnosis present

## 2017-01-14 DIAGNOSIS — W1830XA Fall on same level, unspecified, initial encounter: Secondary | ICD-10-CM | POA: Diagnosis present

## 2017-01-14 DIAGNOSIS — R778 Other specified abnormalities of plasma proteins: Secondary | ICD-10-CM

## 2017-01-14 DIAGNOSIS — Z66 Do not resuscitate: Secondary | ICD-10-CM | POA: Diagnosis present

## 2017-01-14 DIAGNOSIS — G9340 Encephalopathy, unspecified: Secondary | ICD-10-CM | POA: Diagnosis present

## 2017-01-14 DIAGNOSIS — R4701 Aphasia: Secondary | ICD-10-CM | POA: Diagnosis not present

## 2017-01-14 DIAGNOSIS — I340 Nonrheumatic mitral (valve) insufficiency: Secondary | ICD-10-CM | POA: Diagnosis present

## 2017-01-14 DIAGNOSIS — W19XXXA Unspecified fall, initial encounter: Secondary | ICD-10-CM

## 2017-01-14 DIAGNOSIS — Z9049 Acquired absence of other specified parts of digestive tract: Secondary | ICD-10-CM

## 2017-01-14 DIAGNOSIS — T796XXA Traumatic ischemia of muscle, initial encounter: Secondary | ICD-10-CM | POA: Diagnosis present

## 2017-01-14 DIAGNOSIS — Z96642 Presence of left artificial hip joint: Secondary | ICD-10-CM | POA: Diagnosis present

## 2017-01-14 DIAGNOSIS — I10 Essential (primary) hypertension: Secondary | ICD-10-CM | POA: Diagnosis present

## 2017-01-14 DIAGNOSIS — E785 Hyperlipidemia, unspecified: Secondary | ICD-10-CM | POA: Diagnosis present

## 2017-01-14 DIAGNOSIS — E860 Dehydration: Secondary | ICD-10-CM | POA: Diagnosis present

## 2017-01-14 DIAGNOSIS — G910 Communicating hydrocephalus: Secondary | ICD-10-CM | POA: Diagnosis present

## 2017-01-14 DIAGNOSIS — H9190 Unspecified hearing loss, unspecified ear: Secondary | ICD-10-CM | POA: Diagnosis present

## 2017-01-14 DIAGNOSIS — I639 Cerebral infarction, unspecified: Secondary | ICD-10-CM

## 2017-01-14 DIAGNOSIS — Y92009 Unspecified place in unspecified non-institutional (private) residence as the place of occurrence of the external cause: Secondary | ICD-10-CM

## 2017-01-14 DIAGNOSIS — Z90710 Acquired absence of both cervix and uterus: Secondary | ICD-10-CM

## 2017-01-14 DIAGNOSIS — R7989 Other specified abnormal findings of blood chemistry: Secondary | ICD-10-CM

## 2017-01-14 LAB — COMPREHENSIVE METABOLIC PANEL
ALK PHOS: 37 U/L — AB (ref 38–126)
ALT: 14 U/L (ref 14–54)
AST: 34 U/L (ref 15–41)
Albumin: 4.5 g/dL (ref 3.5–5.0)
Anion gap: 12 (ref 5–15)
BILIRUBIN TOTAL: 0.8 mg/dL (ref 0.3–1.2)
BUN: 13 mg/dL (ref 6–20)
CALCIUM: 9 mg/dL (ref 8.9–10.3)
CO2: 24 mmol/L (ref 22–32)
Chloride: 98 mmol/L — ABNORMAL LOW (ref 101–111)
Creatinine, Ser: 0.59 mg/dL (ref 0.44–1.00)
GLUCOSE: 145 mg/dL — AB (ref 65–99)
POTASSIUM: 3.6 mmol/L (ref 3.5–5.1)
Sodium: 134 mmol/L — ABNORMAL LOW (ref 135–145)
TOTAL PROTEIN: 7 g/dL (ref 6.5–8.1)

## 2017-01-14 LAB — CBC WITH DIFFERENTIAL/PLATELET
Basophils Absolute: 0 10*3/uL (ref 0.0–0.1)
Basophils Relative: 0 %
Eosinophils Absolute: 0 10*3/uL (ref 0.0–0.7)
Eosinophils Relative: 0 %
HEMATOCRIT: 35.1 % — AB (ref 36.0–46.0)
HEMOGLOBIN: 11.9 g/dL — AB (ref 12.0–15.0)
LYMPHS ABS: 0.9 10*3/uL (ref 0.7–4.0)
Lymphocytes Relative: 7 %
MCH: 33.8 pg (ref 26.0–34.0)
MCHC: 33.9 g/dL (ref 30.0–36.0)
MCV: 99.7 fL (ref 78.0–100.0)
MONO ABS: 0.6 10*3/uL (ref 0.1–1.0)
MONOS PCT: 5 %
NEUTROS ABS: 11.6 10*3/uL — AB (ref 1.7–7.7)
NEUTROS PCT: 88 %
Platelets: 232 10*3/uL (ref 150–400)
RBC: 3.52 MIL/uL — ABNORMAL LOW (ref 3.87–5.11)
RDW: 14.5 % (ref 11.5–15.5)
WBC: 13.1 10*3/uL — ABNORMAL HIGH (ref 4.0–10.5)

## 2017-01-14 LAB — LIPASE, BLOOD: LIPASE: 19 U/L (ref 11–51)

## 2017-01-14 LAB — URINALYSIS, ROUTINE W REFLEX MICROSCOPIC
BACTERIA UA: NONE SEEN
BILIRUBIN URINE: NEGATIVE
Glucose, UA: 50 mg/dL — AB
Ketones, ur: 20 mg/dL — AB
LEUKOCYTES UA: NEGATIVE
Nitrite: NEGATIVE
Protein, ur: 100 mg/dL — AB
SPECIFIC GRAVITY, URINE: 1.014 (ref 1.005–1.030)
SQUAMOUS EPITHELIAL / LPF: NONE SEEN
pH: 7 (ref 5.0–8.0)

## 2017-01-14 LAB — TROPONIN I: Troponin I: 1.36 ng/mL (ref <0.03)

## 2017-01-14 LAB — I-STAT TROPONIN, ED: TROPONIN I, POC: 0.72 ng/mL — AB (ref 0.00–0.08)

## 2017-01-14 LAB — CK: CK TOTAL: 427 U/L — AB (ref 38–234)

## 2017-01-14 LAB — I-STAT CG4 LACTIC ACID, ED: LACTIC ACID, VENOUS: 1.49 mmol/L (ref 0.5–1.9)

## 2017-01-14 MED ORDER — STROKE: EARLY STAGES OF RECOVERY BOOK: Freq: Once | Status: DC

## 2017-01-14 MED ORDER — ACETAMINOPHEN 325 MG PO TABS
650.0000 mg | ORAL_TABLET | ORAL | Status: DC | PRN
  Administered 2017-01-15 – 2017-01-16 (×2): 650 mg via ORAL
  Filled 2017-01-14 (×2): qty 2

## 2017-01-14 MED ORDER — SODIUM CHLORIDE 0.9 % IV BOLUS (SEPSIS)
1000.0000 mL | Freq: Once | INTRAVENOUS | Status: AC
  Administered 2017-01-14: 1000 mL via INTRAVENOUS

## 2017-01-14 MED ORDER — SENNOSIDES-DOCUSATE SODIUM 8.6-50 MG PO TABS
1.0000 | ORAL_TABLET | Freq: Every evening | ORAL | Status: DC | PRN
Start: 2017-01-14 — End: 2017-01-16

## 2017-01-14 MED ORDER — ASPIRIN EC 325 MG PO TBEC
325.0000 mg | DELAYED_RELEASE_TABLET | Freq: Every day | ORAL | Status: DC
  Filled 2017-01-14 (×2): qty 1

## 2017-01-14 MED ORDER — ACETAMINOPHEN 650 MG RE SUPP: 650.0000 mg | RECTAL | Status: DC | PRN

## 2017-01-14 MED ORDER — SIMVASTATIN 40 MG PO TABS
40.0000 mg | ORAL_TABLET | Freq: Every evening | ORAL | Status: DC
  Filled 2017-01-14: qty 1

## 2017-01-14 MED ORDER — ENOXAPARIN SODIUM 40 MG/0.4ML ~~LOC~~ SOLN
40.0000 mg | SUBCUTANEOUS | Status: DC
  Administered 2017-01-14 – 2017-01-15 (×2): 40 mg via SUBCUTANEOUS
  Filled 2017-01-14 (×2): qty 0.4

## 2017-01-14 MED ORDER — ACETAMINOPHEN 160 MG/5ML PO SOLN: 650.0000 mg | ORAL | Status: DC | PRN

## 2017-01-14 MED ORDER — ACETAMINOPHEN 325 MG PO TABS
650.0000 mg | ORAL_TABLET | Freq: Once | ORAL | Status: AC
  Administered 2017-01-14: 650 mg via ORAL
  Filled 2017-01-14: qty 2

## 2017-01-14 NOTE — Clinical Social Work Note (Signed)
Clinical Social Work Assessment  Patient Details  Name: Christine Lyons MRN: [id number] Date of Birth: April 16, 1925  Date of referral:  01/14/17               Reason for consult:  Facility Placement, Discharge Planning                Permission sought to share information with:  Facility Art therapist granted to share information::  Yes, Verbal Permission Granted  Name::        Agency::     Relationship::     Contact Information:     Housing/Transportation Living arrangements for the past 2 months:  [place] of Information:  Spouse Patient Interpreter Needed:  None Criminal Activity/Legal Involvement Pertinent to Current Situation/Hospitalization:    Significant Relationships:  Adult Children Lives with:  Self Do you feel safe going back to the place where you live?  No Need for family participation in patient care:  Yes (Comment)  Care giving concerns:  Unsure if patient will be able to return to Independent Living once stable for discharge.    Social Worker assessment / plan:  CSW spoke with patients daughter briefly at bedside. Patient is from [facility] however daughter is unsure patient will be able to return due to medical concerns that brought her into hospital. Patient was disoriented X4 and unable to answer questions. Daughter was tearful while CSW was in room. CSW informed family she could come back at a later time or if family needed anything they could ask to speak with social work.  Employment status:  Retired Forensic scientist:  Medicare PT Recommendations:  Not assessed at this time Information / Referral to community resources:     Patient/Family's Response to care:  Patients daughter appreciated CSW.   Patient/Family's Understanding of and Emotional Response to Diagnosis, Current Treatment, and Prognosis:  Unknown at this time.   Emotional Assessment Appearance:  Appears stated  age Attitude/Demeanor/Rapport:    Affect (typically observed):    Orientation:    Alcohol / Substance use:    Psych involvement (Current and /or in the community):     Discharge Needs  Concerns to be addressed:  Discharge Planning Concerns (Unsure of discharge plans. ) Readmission within the last 30 days:  No Current discharge risk:  None Barriers to Discharge:  No Barriers Identified   Weston Anna, LCSW 01/14/2017, 4:22 PM

## 2017-01-14 NOTE — Progress Notes (Signed)
CRITICAL VALUE ALERT  Critical value received:  Troponin 1.36  Date of notification:  01/14/2017  Time of notification:  1958  Critical value read back:Yes.    Nurse who received alert:  Carnella Guadalajara I   MD notified (1st page):  T. Opyd, MD  Time of first page:  2015  No new orders given at this time. Will continue to monitor the pt closely. Carnella Guadalajara I

## 2017-01-14 NOTE — ED Notes (Signed)
Bed: WA24 Expected date:  Expected time:  Means of arrival:  Comments: 

## 2017-01-14 NOTE — ED Triage Notes (Addendum)
Pt bib EMS from [facility] after an unwitnessed fall.  Pt was found by staff in her apartment in the bathroom.  The last thing pt remembered was using the bathroom.  EMS states that the bathroom had diarrhea everywhere. Pt does not remember the event.  Pt does not take blood thinners except 81 mg of aspirin.  Pt is normally a/o x4 at baseline.  Today pt is does not know event but was oriented to place and person. Pt currently sleeping. EMS's EKG was unremarkable.

## 2017-01-14 NOTE — H&P (Signed)
History and Physical    Christine Lyons NKN:[id number] DOB: [birth year] DOA: 01/14/2017  I have briefly reviewed the patient's prior medical records in [facility]  PCP: Javier Glazier, MD  Patient coming from: [facility]  Chief Complaint: She was found down  HPI: Christine Lyons is a 83 y.o. female with medical history significant of hypertension, hyperlipidemia, otherwise active 83 year old who currently lives in an independent living facility, and was found down this morning.  The daughter who is in the room tells most of the story as patient is confused in the ED.  She tells me that she talk to the patient's last night, and everything seems to be normal.  This morning, the nursing staff at her facility did not see the patient as usual, went to check on her and she was found down on the floor and appeared to have had an episode of diarrhea.  Daughter is telling me that she has been having intermittent diarrhea for very long time.  Patient is confused in the ED and cannot contribute much to the story, and appears to have difficulties getting the words out.  She currently has no complaints, she denies any chest pain, denies any shortness of breath.  She "thinks" that her abdomen is hurting, but does not appear to be sure.  There are no reported fever or chills, no reported coughing, no dysuria, and no other issues.  ED Course: In the ED she was initially afebrile at 97.9, repeat temperature was 101.2.  She is not tachycardic and normotensive.  Her blood work reveals normal renal function, slight elevation of white count at 13, normal lactic acid, troponin of 0.7 and a CK of 427.  Urinalysis does not show evidence of infection, chest x-rays negative for infiltrates and CT head is without acute findings.  Review of Systems: Unable to obtain full review of systems due to acute encephalopathy  Past Medical History:  Diagnosis Date  . Bradycardia   . Diverticulitis   . Esophageal reflux     . GERD (gastroesophageal reflux disease)   . Hip fracture (HCC)    left, Dr. Alvan Dame  . Hx of echocardiogram    a. Echo 09/08/12: EF 37%, grade 1 diastolic dysfunction.  . Hyperlipidemia   . Hypertension   . Osteopenia   . Personal history of other malignant neoplasm of skin   . Spinal stenosis   . Stroke [facility]) 1999   TIA in [place]  . Unspecified hearing loss     Past Surgical History:  Procedure Laterality Date  . ABDOMINAL HYSTERECTOMY     with BSO for dysfunctional menses  . APPENDECTOMY    . CHOLECYSTECTOMY    . colonscopy     diverticulosis  . G 7 P 6    . HIP ARTHROPLASTY  08/29/2012   Procedure: ARTHROPLASTY BIPOLAR HIP;  Surgeon: Mauri Pole, MD;  Location: [facility];  Service: Orthopedics;  Laterality: Left;  LEFT HIP HEMIARTHROPLASTY  . LAPAROSCOPIC GASTRIC BANDING     to prevent GERD  . TONSILLECTOMY AND ADENOIDECTOMY       reports that she has never smoked. She has never used smokeless tobacco. She reports that she drinks about 2.5 oz of alcohol per week . She reports that she does not use drugs.  Allergies  Allergen Reactions  . Codeine Nausea Only and Anxiety    Family History  Problem Relation Age of Onset  . Hypertension Mother   . Dementia Father   .  Heart disease Brother     died of an MI, late 43s    Prior to Admission medications   Medication Sig Start Date End Date Taking? Authorizing Provider  amLODipine (NORVASC) 5 MG tablet Take 5 mg by mouth daily.  09/16/15  Yes Historical Provider, MD  aspirin EC 325 MG tablet Take 1 tablet (325 mg total) by mouth daily. 09/24/16  Yes Doreatha Lew, MD  losartan (COZAAR) 100 MG tablet APPOINTMENT DUE, 1 by mouth daily Patient taking differently: Take 100 mg by mouth daily.  12/04/12  Yes Hendricks Limes, MD  simvastatin (ZOCOR) 40 MG tablet Take 1 tablet (40 mg total) by mouth every evening. 09/23/16  Yes Doreatha Lew, MD    Physical Exam: Vitals:   01/14/17 1300 01/14/17 1433 01/14/17 1434  01/14/17 1522  BP: 140/69  (!) 131/56 (!) 132/52  Pulse: 73  70 70  Resp: 11  17 16   Temp:  (!) 101.2 F (38.4 C)    TempSrc:  Rectal    SpO2: 96%  96% 97%  Weight:      Height:       Constitutional: NAD, calm, comfortable, confused Eyes: PERRL, lids and conjunctivae normal ENMT: Mucous membranes are moist.  Neck: normal, supple Respiratory: clear to auscultation bilaterally, no wheezing, no crackles.  Cardiovascular: Regular rate and rhythm, no murmurs / rubs / gallops. No extremity edema. 2+ pedal pulses.  Abdomen: no tenderness, no masses palpated. Bowel sounds positive.  Musculoskeletal: no clubbing / cyanosis. Normal muscle tone.  Skin: no rashes, lesions, ulcers. No induration Neurologic: CN 2-12 grossly intact. Strength 5-/5 RLE, 5/5 others. Aphasia Psychiatric: Normal judgment and insight. Normal mood.   Labs on Admission: I have personally reviewed following labs and imaging studies  CBC:  Recent Labs Lab 01/14/17 1331  WBC 13.1*  NEUTROABS 11.6*  HGB 11.9*  HCT 35.1*  MCV 99.7  PLT 244   Basic Metabolic Panel:  Recent Labs Lab 01/14/17 1331  NA 134*  K 3.6  CL 98*  CO2 24  GLUCOSE 145*  BUN 13  CREATININE 0.59  CALCIUM 9.0   GFR: Estimated Creatinine Clearance: 35.3 mL/min (by C-G formula based on SCr of 0.59 mg/dL). Liver Function Tests:  Recent Labs Lab 01/14/17 1331  AST 34  ALT 14  ALKPHOS 37*  BILITOT 0.8  PROT 7.0  ALBUMIN 4.5    Recent Labs Lab 01/14/17 1331  LIPASE 19   No results for input(s): AMMONIA in the last 168 hours. Coagulation Profile: No results for input(s): INR, PROTIME in the last 168 hours. Cardiac Enzymes:  Recent Labs Lab 01/14/17 1331  CKTOTAL 427*   BNP (last 3 results) No results for input(s): PROBNP in the last 8760 hours. HbA1C: No results for input(s): HGBA1C in the last 72 hours. CBG: No results for input(s): GLUCAP in the last 168 hours. Lipid Profile: No results for input(s): CHOL, HDL,  LDLCALC, TRIG, CHOLHDL, LDLDIRECT in the last 72 hours. Thyroid Function Tests: No results for input(s): TSH, T4TOTAL, FREET4, T3FREE, THYROIDAB in the last 72 hours. Anemia Panel: No results for input(s): VITAMINB12, FOLATE, FERRITIN, TIBC, IRON, RETICCTPCT in the last 72 hours. Urine analysis:    Component Value Date/Time   COLORURINE YELLOW 01/14/2017 1430   APPEARANCEUR CLEAR 01/14/2017 1430   LABSPEC 1.014 01/14/2017 1430   PHURINE 7.0 01/14/2017 1430   GLUCOSEU 50 (A) 01/14/2017 1430   HGBUR MODERATE (A) 01/14/2017 1430   BILIRUBINUR NEGATIVE 01/14/2017 1430   KETONESUR  20 (A) 01/14/2017 1430   PROTEINUR 100 (A) 01/14/2017 1430   UROBILINOGEN 0.2 08/28/2012 1453   NITRITE NEGATIVE 01/14/2017 1430   LEUKOCYTESUR NEGATIVE 01/14/2017 1430    Radiological Exams on Admission: Dg Chest 2 View  Result Date: 01/14/2017 CLINICAL DATA:  Altered mental status, unwitnessed fall. EXAM: CHEST  2 VIEW COMPARISON:  08/28/2012 FINDINGS: Top normal size cardiac silhouette. No aortic aneurysm. Mild vascular congestion without pneumonic consolidation, effusion or pneumothorax. Osteoarthritis of the [facility] and glenohumeral joints bilaterally. Numerous surgical clips project over the epigastric and left upper quadrant of the abdomen. The patient is status post T12 kyphoplasty. IMPRESSION: No acute pulmonary disease.  Mild vascular congestion. Electronically Signed   By: Ashley Royalty M.D.   On: 01/14/2017 13:43   Ct Head Wo Contrast  Result Date: 01/14/2017 CLINICAL DATA:  Altered mental status EXAM: CT HEAD WITHOUT CONTRAST TECHNIQUE: Contiguous axial images were obtained from the base of the skull through the vertex without intravenous contrast. COMPARISON:  MRI head 09/23/2016 FINDINGS: Brain: Ventricular enlargement unchanged. There is diffuse atrophy. Ventricle size slightly greater than expected for the level of atrophy raising the possibility of communicating hydrocephalus. This is stable from prior  studies. Negative for acute infarct, hemorrhage, or mass lesion. Vascular: Negative for hyperdense vessel Skull: Negative Sinuses/Orbits: Negative.  Bilateral cataract removal Other: None IMPRESSION: Moderate ventricular enlargement is stable from prior studies. This may be due to atrophy or communicating hydrocephalus. No acute intracranial abnormality. Electronically Signed   By: Franchot Gallo M.D.   On: 01/14/2017 14:28    EKG: Independently reviewed. Sinus rhythm  Assessment/Plan Active Problems:   Hyperlipidemia   Hearing loss   HTN (hypertension)   TIA (transient ischemic attack)   Fall in home   Aphasia    TIA/concern for stroke -Patient is confused in the ED, and she does not have any focal findings however appears to be aphasic, appears to have difficulties understanding and following commands and getting the words out. -Obtain MRI MRA of the brain -Obtain 2D echo given that she was found down and she has mild elevation of her troponin, if MRI positive will complete the rest of the stroke workup and consult neurology -Continue her aspirin, daughter tells me that she is not taking her aspirin  Hypertension -Allow permissive hypertension for now  Hyperlipidemia -Continue statin, obtain lipid panel  Acute encephalopathy -As per #1,  Elevated troponin -Continue to cycle cardiac enzymes, no chest pain, EKG nonischemic, 2D echo as above.  She is not a great candidate for aggressive cardiac evaluation  Fever -one time, no apparent infectious source,?  Related to rhabdo  Rhabdomyolysis -gentle IV fluids given slight congestion on CXR, recheck CK in the morning, it is not severe but may get worse   DVT prophylaxis: Lovenox  Code Status: DNR  Family Communication: daughter bedside Disposition Plan: admit to telemetry  Consults called: none     Admission status: Observation   At the point of initial evaluation, it is my clinical opinion that admission for OBSERVATION  is reasonable and necessary because the patient's presenting complaints in the context of their chronic conditions represent sufficient risk of deterioration or significant morbidity to constitute reasonable grounds for close observation in the hospital setting, but that the patient may be medically stable for discharge from the hospital within 24 to 48 hours.   Marzetta Board, MD [facility] Pager [phone]  If 7PM-7AM, please contact night-coverage [email or web address] Password [email or web address]  01/14/2017, 3:47 PM

## 2017-01-14 NOTE — ED Notes (Signed)
Patient transported to CT 

## 2017-01-14 NOTE — ED Provider Notes (Signed)
[facility] DEPT Provider Note   CSN: [id number] Arrival date & time: 01/14/17  1128     History   Chief Complaint Chief Complaint  Patient presents with  . Fall    HPI Christine Lyons is a 83 y.o. female.  Patient is a 83 year old female with a history of hypertension, hyperlipidemia, stroke, bradycardia and GERD presenting today after being found down on her bathroom floor. Patient lives in the independent section of an elderly community. Daughter states that she spoke with her around 8 PM last night and she was her normal self. This morning around 10:00 staff found her lying on her bathroom floor with loose stool scattered throughout the bathroom but awake. Patient currently is unable to answer questions and seems confused. She denies shortness of breath and unclear if she is hurting. It is uncertain if patient had loss of consciousness for one report the last thing she remembered was going to the bathroom. She does take an 81 mg aspirin but family is not sure how consistently she takes that.   The history is provided by the EMS personnel and a relative. The history is limited by the condition of the patient.  Fall  This is a new problem.    Past Medical History:  Diagnosis Date  . Bradycardia   . Diverticulitis   . Esophageal reflux   . GERD (gastroesophageal reflux disease)   . Hip fracture (HCC)    left, Dr. Alvan Dame  . Hx of echocardiogram    a. Echo 09/08/12: EF 40%, grade 1 diastolic dysfunction.  . Hyperlipidemia   . Hypertension   . Osteopenia   . Personal history of other malignant neoplasm of skin   . Spinal stenosis   . Stroke [facility]) 1999   TIA in [place]  . Unspecified hearing loss     Patient Active Problem List   Diagnosis Date Noted  . Aphasia   . Fall in home 12/24/2015  . At risk for falling 12/24/2015  . Basal cell carcinoma of ala nasi 04/26/2014  . HTN (hypertension) 08/30/2012  . Hypotension 08/30/2012  . TIA (transient ischemic attack) 08/30/2012   . Bradycardia 08/29/2012  . Hip fracture ([place]) 08/28/2012  . UNSPECIFIED HEARING LOSS 11/09/2010  . SPINAL STENOSIS, LUMBAR 03/23/2010  . LOW BACK PAIN SYNDROME 03/23/2010  . Hyperlipidemia 08/27/2009  . GERD 08/27/2009  . OSTEOPENIA 08/27/2009  . SKIN CANCER, HX OF 08/27/2009  . TRANSIENT ISCHEMIC ATTACK, HX OF 08/27/2009  . DIVERTICULITIS, HX OF 08/27/2009  . Bone/cartilage disorder 08/27/2009    Past Surgical History:  Procedure Laterality Date  . ABDOMINAL HYSTERECTOMY     with BSO for dysfunctional menses  . APPENDECTOMY    . CHOLECYSTECTOMY    . colonscopy     diverticulosis  . G 7 P 6    . HIP ARTHROPLASTY  08/29/2012   Procedure: ARTHROPLASTY BIPOLAR HIP;  Surgeon: Mauri Pole, MD;  Location: [facility];  Service: Orthopedics;  Laterality: Left;  LEFT HIP HEMIARTHROPLASTY  . LAPAROSCOPIC GASTRIC BANDING     to prevent GERD  . TONSILLECTOMY AND ADENOIDECTOMY      OB History    No data available       Home Medications    Prior to Admission medications   Medication Sig Start Date End Date Taking? Authorizing Provider  amLODipine (NORVASC) 5 MG tablet Take 5 mg by mouth daily.  09/16/15  Yes Historical Provider, MD  aspirin EC 325 MG tablet Take 1 tablet (325  mg total) by mouth daily. 09/24/16  Yes Doreatha Lew, MD  losartan (COZAAR) 100 MG tablet APPOINTMENT DUE, 1 by mouth daily Patient taking differently: Take 100 mg by mouth daily.  12/04/12  Yes Hendricks Limes, MD  simvastatin (ZOCOR) 40 MG tablet Take 1 tablet (40 mg total) by mouth every evening. 09/23/16  Yes Doreatha Lew, MD    Family History Family History  Problem Relation Age of Onset  . Hypertension Mother   . Dementia Father   . Heart disease Brother     died of an MI, late 55s    Social History Social History  Substance Use Topics  . Smoking status: Never Smoker  . Smokeless tobacco: Never Used  . Alcohol use 2.5 oz/week    5 Standard drinks or equivalent per week      Comment: 1-2 glasses of wine most nights     Allergies   Codeine   Review of Systems Review of Systems  All other systems reviewed and are negative.    Physical Exam Updated Vital Signs BP 140/69   Pulse 73   Temp 97.9 F (36.6 C) (Oral)   Resp 11   Ht 5\' 1"  (1.549 m)   Wt 120 lb (54.4 kg)   SpO2 96%   BMI 22.67 kg/m   Physical Exam  Constitutional: She appears well-developed and well-nourished.  Patient intermittently feel slightly uncomfortable but unable to pinpoint area of pain  HENT:  Head: Normocephalic and atraumatic.  Mouth/Throat: Mucous membranes are dry.  Eyes: Conjunctivae and EOM are normal. Pupils are equal, round, and reactive to light.  Neck: Normal range of motion. Neck supple.  Cardiovascular: Normal rate, regular rhythm and intact distal pulses.   No murmur heard. Pulmonary/Chest: Effort normal and breath sounds normal. No respiratory distress. She has no wheezes. She has no rales.  Abdominal: Soft. She exhibits no distension. There is no tenderness. There is no rebound and no guarding.  Musculoskeletal: Normal range of motion. She exhibits no edema or tenderness.  No reproducible back pain  Neurological: She is alert.  Patient seems confused. Has difficulty answering questions. Intermittently shifting in the bed but moves all 4 extremities. No notable weakness but unable to coordinate  Skin: Skin is warm and dry. No rash noted. No erythema.  Nursing note and vitals reviewed.    ED Treatments / Results  Labs (all labs ordered are listed, but only abnormal results are displayed) Labs Reviewed  CBC WITH DIFFERENTIAL/PLATELET - Abnormal; Notable for the following:       Result Value   WBC 13.1 (*)    RBC 3.52 (*)    Hemoglobin 11.9 (*)    HCT 35.1 (*)    Neutro Abs 11.6 (*)    All other components within normal limits  COMPREHENSIVE METABOLIC PANEL - Abnormal; Notable for the following:    Sodium 134 (*)    Chloride 98 (*)    Glucose, Bld  145 (*)    Alkaline Phosphatase 37 (*)    All other components within normal limits  URINALYSIS, ROUTINE W REFLEX MICROSCOPIC - Abnormal; Notable for the following:    Glucose, UA 50 (*)    Hgb urine dipstick MODERATE (*)    Ketones, ur 20 (*)    Protein, ur 100 (*)    All other components within normal limits  CK - Abnormal; Notable for the following:    Total CK 427 (*)    All other components within normal  limits  I-STAT TROPOININ, ED - Abnormal; Notable for the following:    Troponin i, poc 0.72 (*)    All other components within normal limits  LIPASE, BLOOD  I-STAT CG4 LACTIC ACID, ED    EKG  EKG Interpretation  Date/Time:  Friday Jan 14 2017 12:10:11 EDT Ventricular Rate:  69 PR Interval:    QRS Duration: 102 QT Interval:  416 QTC Calculation: 446 R Axis:   23 Text Interpretation:  Sinus rhythm Probable left atrial enlargement Left ventricular hypertrophy No significant change since last tracing Confirmed by Maryan Rued  MD, Loree Fee ([id number]) on 01/14/2017 1:33:20 PM       Radiology Dg Chest 2 View  Result Date: 01/14/2017 CLINICAL DATA:  Altered mental status, unwitnessed fall. EXAM: CHEST  2 VIEW COMPARISON:  08/28/2012 FINDINGS: Top normal size cardiac silhouette. No aortic aneurysm. Mild vascular congestion without pneumonic consolidation, effusion or pneumothorax. Osteoarthritis of the [facility] and glenohumeral joints bilaterally. Numerous surgical clips project over the epigastric and left upper quadrant of the abdomen. The patient is status post T12 kyphoplasty. IMPRESSION: No acute pulmonary disease.  Mild vascular congestion. Electronically Signed   By: Ashley Royalty M.D.   On: 01/14/2017 13:43   Ct Head Wo Contrast  Result Date: 01/14/2017 CLINICAL DATA:  Altered mental status EXAM: CT HEAD WITHOUT CONTRAST TECHNIQUE: Contiguous axial images were obtained from the base of the skull through the vertex without intravenous contrast. COMPARISON:  MRI head 09/23/2016 FINDINGS:  Brain: Ventricular enlargement unchanged. There is diffuse atrophy. Ventricle size slightly greater than expected for the level of atrophy raising the possibility of communicating hydrocephalus. This is stable from prior studies. Negative for acute infarct, hemorrhage, or mass lesion. Vascular: Negative for hyperdense vessel Skull: Negative Sinuses/Orbits: Negative.  Bilateral cataract removal Other: None IMPRESSION: Moderate ventricular enlargement is stable from prior studies. This may be due to atrophy or communicating hydrocephalus. No acute intracranial abnormality. Electronically Signed   By: Franchot Gallo M.D.   On: 01/14/2017 14:28    Procedures Procedures (including critical care time)  Medications Ordered in ED Medications  sodium chloride 0.9 % bolus 1,000 mL (not administered)     Initial Impression / Assessment and Plan / ED Course  I have reviewed the triage vital signs and the nursing notes.  Pertinent labs & imaging results that were available during my care of the patient were reviewed by me and considered in my medical decision making (see chart for details).    Patient is a 83 year old female found down today in her bathroom who has altered mental status. Patient normally is awake alert and oriented and lives independently without difficulty. Last seen normal at 8 PM last night. Patient is unable to answer questions appropriately. She seems confused but is alert. She has no focal neurologic findings but concern for possible stroke or head bleed. Also concern for potential sepsis, infection, dehydration. Vital signs are within normal limits. EKG without acute findings. Patient will be given IV fluids. CBC, CMP, UA, lipase, head CT, chest x-ray, troponin, lactate, CK pending.  3:10 PM Patient found to be febrile at 101.2. Also elevated troponin of 0.72 and leukocytosis of 13,000. CK of greater than 400 but normal renal function. UA without evidence of infection. Chest x-ray  and head CT without acute findings. Some improvement with Tylenol and IV fluids. Will admit for further care.  Confirmed with family pt is a DNR.  Final Clinical Impressions(s) / ED Diagnoses   Final diagnoses:  Fever, unspecified  fever cause  Altered mental status, unspecified altered mental status type  Dehydration  Elevated troponin    New Prescriptions New Prescriptions   No medications on file     Blanchie Dessert, MD 01/14/17 1517

## 2017-01-15 ENCOUNTER — Observation Stay (HOSPITAL_COMMUNITY): Payer: Medicare Other

## 2017-01-15 ENCOUNTER — Observation Stay (HOSPITAL_BASED_OUTPATIENT_CLINIC_OR_DEPARTMENT_OTHER): Payer: Medicare Other

## 2017-01-15 DIAGNOSIS — G910 Communicating hydrocephalus: Secondary | ICD-10-CM | POA: Diagnosis present

## 2017-01-15 DIAGNOSIS — W1830XA Fall on same level, unspecified, initial encounter: Secondary | ICD-10-CM | POA: Diagnosis present

## 2017-01-15 DIAGNOSIS — R748 Abnormal levels of other serum enzymes: Secondary | ICD-10-CM | POA: Diagnosis not present

## 2017-01-15 DIAGNOSIS — Z9049 Acquired absence of other specified parts of digestive tract: Secondary | ICD-10-CM | POA: Diagnosis not present

## 2017-01-15 DIAGNOSIS — T796XXA Traumatic ischemia of muscle, initial encounter: Secondary | ICD-10-CM | POA: Diagnosis not present

## 2017-01-15 DIAGNOSIS — I10 Essential (primary) hypertension: Secondary | ICD-10-CM

## 2017-01-15 DIAGNOSIS — Z8249 Family history of ischemic heart disease and other diseases of the circulatory system: Secondary | ICD-10-CM | POA: Diagnosis not present

## 2017-01-15 DIAGNOSIS — E860 Dehydration: Secondary | ICD-10-CM | POA: Diagnosis present

## 2017-01-15 DIAGNOSIS — I340 Nonrheumatic mitral (valve) insufficiency: Secondary | ICD-10-CM | POA: Diagnosis present

## 2017-01-15 DIAGNOSIS — E785 Hyperlipidemia, unspecified: Secondary | ICD-10-CM | POA: Diagnosis present

## 2017-01-15 DIAGNOSIS — I6789 Other cerebrovascular disease: Secondary | ICD-10-CM

## 2017-01-15 DIAGNOSIS — K219 Gastro-esophageal reflux disease without esophagitis: Secondary | ICD-10-CM | POA: Diagnosis present

## 2017-01-15 DIAGNOSIS — T796XXD Traumatic ischemia of muscle, subsequent encounter: Secondary | ICD-10-CM

## 2017-01-15 DIAGNOSIS — G9340 Encephalopathy, unspecified: Secondary | ICD-10-CM | POA: Diagnosis present

## 2017-01-15 DIAGNOSIS — G458 Other transient cerebral ischemic attacks and related syndromes: Secondary | ICD-10-CM

## 2017-01-15 DIAGNOSIS — Z90710 Acquired absence of both cervix and uterus: Secondary | ICD-10-CM | POA: Diagnosis not present

## 2017-01-15 DIAGNOSIS — G459 Transient cerebral ischemic attack, unspecified: Secondary | ICD-10-CM | POA: Diagnosis present

## 2017-01-15 DIAGNOSIS — Z96642 Presence of left artificial hip joint: Secondary | ICD-10-CM | POA: Diagnosis present

## 2017-01-15 DIAGNOSIS — R29703 NIHSS score 3: Secondary | ICD-10-CM | POA: Diagnosis not present

## 2017-01-15 DIAGNOSIS — M8580 Other specified disorders of bone density and structure, unspecified site: Secondary | ICD-10-CM | POA: Diagnosis present

## 2017-01-15 DIAGNOSIS — Z8673 Personal history of transient ischemic attack (TIA), and cerebral infarction without residual deficits: Secondary | ICD-10-CM | POA: Diagnosis not present

## 2017-01-15 DIAGNOSIS — H9190 Unspecified hearing loss, unspecified ear: Secondary | ICD-10-CM | POA: Diagnosis present

## 2017-01-15 DIAGNOSIS — G43909 Migraine, unspecified, not intractable, without status migrainosus: Secondary | ICD-10-CM | POA: Diagnosis not present

## 2017-01-15 DIAGNOSIS — R4701 Aphasia: Secondary | ICD-10-CM | POA: Diagnosis present

## 2017-01-15 DIAGNOSIS — Z885 Allergy status to narcotic agent status: Secondary | ICD-10-CM | POA: Diagnosis not present

## 2017-01-15 DIAGNOSIS — Z66 Do not resuscitate: Secondary | ICD-10-CM | POA: Diagnosis present

## 2017-01-15 LAB — ECHOCARDIOGRAM COMPLETE
CHL CUP MV DEC (S): 243
E/e' ratio: 9.28
EWDT: 243 ms
FS: 26 % — AB (ref 28–44)
HEIGHTINCHES: 61 in
IV/PV OW: 1.48
LA diam end sys: 36 mm
LA vol: 52.2 mL
LADIAMINDEX: 2.26 cm/m2
LASIZE: 36 mm
LAVOLA4C: 54.2 mL
LAVOLIN: 32.8 mL/m2
LV E/e' medial: 9.28
LV E/e'average: 9.28
LV PW d: 8.67 mm — AB (ref 0.6–1.1)
LVELAT: 8.05 cm/s
LVOT SV: 64 mL
LVOT VTI: 28.1 cm
LVOT area: 2.27 cm2
LVOT peak grad rest: 7 mmHg
LVOT peak vel: 132 cm/s
LVOTD: 17 mm
Lateral S' vel: 13.5 cm/s
MV Peak grad: 2 mmHg
MV pk A vel: 108 m/s
MVPKEVEL: 74.7 m/s
RV sys press: 28 mmHg
Reg peak vel: 226 cm/s
TAPSE: 22 mm
TDI e' lateral: 8.05
TDI e' medial: 6.85
TRMAXVEL: 226 cm/s
Weight: 2049.4 oz

## 2017-01-15 LAB — LIPID PANEL
Cholesterol: 141 mg/dL (ref 0–200)
HDL: 62 mg/dL (ref >40)
LDL Cholesterol: 63 mg/dL (ref 0–99)
TRIGLYCERIDES: 79 mg/dL (ref <150)
Total CHOL/HDL Ratio: 2.3 RATIO
VLDL: 16 mg/dL (ref 0–40)

## 2017-01-15 LAB — TROPONIN I
TROPONIN I: 1.24 ng/mL — AB (ref <0.03)
Troponin I: 0.93 ng/mL (ref <0.03)

## 2017-01-15 MED ORDER — KETOROLAC TROMETHAMINE 15 MG/ML IJ SOLN
15.0000 mg | Freq: Four times a day (QID) | INTRAMUSCULAR | Status: DC | PRN
  Administered 2017-01-15 – 2017-01-16 (×3): 15 mg via INTRAVENOUS
  Filled 2017-01-15 (×3): qty 1

## 2017-01-15 MED ORDER — ONDANSETRON HCL 4 MG/2ML IJ SOLN
4.0000 mg | Freq: Three times a day (TID) | INTRAMUSCULAR | Status: DC | PRN
  Administered 2017-01-15 (×2): 4 mg via INTRAVENOUS
  Filled 2017-01-15 (×2): qty 2

## 2017-01-15 MED ORDER — FAMOTIDINE IN NACL 20-0.9 MG/50ML-% IV SOLN
20.0000 mg | Freq: Once | INTRAVENOUS | Status: AC
  Administered 2017-01-15: 20 mg via INTRAVENOUS
  Filled 2017-01-15: qty 50

## 2017-01-15 MED ORDER — LOSARTAN POTASSIUM 50 MG PO TABS
100.0000 mg | ORAL_TABLET | Freq: Every day | ORAL | Status: DC
  Administered 2017-01-15 – 2017-01-16 (×2): 100 mg via ORAL
  Filled 2017-01-15 (×2): qty 2

## 2017-01-15 MED ORDER — AMLODIPINE BESYLATE 5 MG PO TABS
5.0000 mg | ORAL_TABLET | Freq: Every day | ORAL | Status: DC
  Administered 2017-01-15 – 2017-01-16 (×2): 5 mg via ORAL
  Filled 2017-01-15 (×2): qty 1

## 2017-01-15 NOTE — Progress Notes (Signed)
OT Cancellation Note  Patient Details Name: Christine Lyons MRN: [id number] DOB: [birth year]   Cancelled Treatment:    Reason Eval/Treat Not Completed: Medical issues which prohibited therapy  Pt is feeling nauseous.   Will check on pt next day  Spoke with family  Kari Baars, [place] Payton Mccallum D 01/15/2017, 10:39 AM

## 2017-01-15 NOTE — Care Management Note (Signed)
[place] NOTIFICATION   Patient Details  Name: Christine Lyons MRN: [id number] Date of Birth: [birth year]   Medicare Observation Status Notification Given:   yes    Norina Buzzard, RN 01/15/2017, 12:54 PM

## 2017-01-15 NOTE — Evaluation (Addendum)
Physical Therapy Evaluation Patient Details Name: Christine Lyons MRN: [id number] DOB: 10/13/1924 Today's Date: 01/15/2017   History of Present Illness  Christine Lyons is a 83 y.o. female with medical history significant of hypertension, hyperlipidemia, otherwise active 83 year old who currently lives in an independent living facility, and was found down this morning.  MRI/A pending; CT negative  Clinical Impression  Patient evaluated by Physical Therapy with no further acute PT needs identified. All education has been completed and the patient has no further questions. See below for any follow-up Physical Therapy or equipment needs. PT is signing off. Thank you for this referral.  Pt generally not feeling well but agreeable to short distance amb with encouragement from PT and her family;  Pt should be able to return to I-living situation at [facility] at D/C; she has had one other recent admission but no LOB with PT this session, she uses rollator at baseline; family very supportive; encouraged pt and family to incr gait/activity as tol--pt grand-dtr is an Therapist, sports here at [employer] and is agreeable as well--will sign off at this time but if need should arise do not hesitate to re-order; thank you     Follow Up Recommendations No PT follow up    Equipment Recommendations       Recommendations for Other Services       Precautions / Restrictions Precautions Precautions: Fall      Mobility  Bed Mobility Overal bed mobility: Modified Independent             General bed mobility comments: incr time  Transfers Overall transfer level: Needs assistance Equipment used: Rolling walker (2 wheeled) Transfers: Sit to/from Stand Sit to Stand: Supervision;Min guard         General transfer comment: cues for safety/hand placement  Ambulation/Gait Ambulation/Gait assistance: Supervision;Min guard Ambulation Distance (Feet): 11 Feet (x2) Assistive device: Rolling walker (2 wheeled) Gait  Pattern/deviations: Step-through pattern;Decreased stride length     General Gait Details: pt uses rollator at baseline so needed cues for RW safety and position/maneuvering; did not feel up to walking greater distance than to bathroom; no LOB, good stability with RW  Stairs            Wheelchair Mobility    Modified Rankin (Stroke Patients Only)       Balance Overall balance assessment: Needs assistance Sitting-balance support: Feet supported Sitting balance-Leahy Scale: Good     Standing balance support: No upper extremity supported;During functional activity Standing balance-Leahy Scale: Fair                               Pertinent Vitals/Pain Pain Assessment: 0-10 Pain Location: head, all over Pain Descriptors / Indicators: Grimacing;Headache Pain Intervention(s): Monitored during session;Premedicated before session    [place] expects to be discharged to:: Other (Comment) (plans return to I-living at [facility]) Living Arrangements: Alone               Additional Comments: Pt currently residing in the independent living section of [place].     Prior Function Level of Independence: Independent with assistive device(s)         Comments: typically uses rollator in apt and outside     Hand Dominance        Extremity/Trunk Assessment   Upper Extremity Assessment Upper Extremity Assessment: Overall WFL for tasks assessed    Lower Extremity Assessment Lower Extremity Assessment: Overall WFL for tasks assessed  Communication   Communication: HOH  Cognition Arousal/Alertness: Awake/alert Behavior During Therapy: WFL for tasks assessed/performed Overall Cognitive Status: Within Functional Limits for tasks assessed                                        General Comments      Exercises     Assessment/Plan    PT Assessment Patent does not need any further PT services  PT Problem List          PT Treatment Interventions      PT Goals (Current goals can be found in the Care Plan section)  Acute Rehab PT Goals Patient Stated Goal: to feel better and go home PT Goal Formulation: All assessment and education complete, DC therapy    Frequency     Barriers to discharge        Co-evaluation               AM-PAC PT "6 Clicks" Daily Activity  Outcome Measure Difficulty turning over in bed (including adjusting bedclothes, sheets and blankets)?: None Difficulty moving from lying on back to sitting on the side of the bed? : None Difficulty sitting down on and standing up from a chair with arms (e.g., wheelchair, bedside commode, etc,.)?: A Little Help needed moving to and from a bed to chair (including a wheelchair)?: A Little Help needed walking in hospital room?: A Little Help needed climbing 3-5 steps with a railing? : A Little 6 Click Score: 20    End of Session Equipment Utilized During Treatment: Gait belt Activity Tolerance: Patient tolerated treatment well;Patient limited by fatigue Patient left: in bed;with call bell/phone within reach;with family/visitor present   PT Visit Diagnosis: Difficulty in walking, not elsewhere classified (R26.2)    Time: 5520-8022 PT Time Calculation (min) (ACUTE ONLY): 15 min   Charges:   PT Evaluation $PT Eval Low Complexity: 1 Procedure     PT G Codes:   PT G-Codes **NOT FOR INPATIENT CLASS** Functional Assessment Tool Used: AM-PAC 6 Clicks Basic Mobility;Clinical judgement Functional Limitation: Mobility: Walking and moving around Mobility: Walking and Moving Around Current Status (V3612): At least 1 percent but less than 20 percent impaired, limited or restricted Mobility: Walking and Moving Around Goal Status [phone]): At least 1 percent but less than 20 percent impaired, limited or restricted Mobility: Walking and Moving Around Discharge Status [phone]): At least 1 percent but less than 20 percent impaired,  limited or restricted    Kenyon Ana, PT Pager: [phone] 01/15/2017   Kenyon Ana 01/15/2017, 1:13 PM

## 2017-01-15 NOTE — Progress Notes (Signed)
NIHSS completed. Pt scored a 3.  Was not able to feel the touch of the end of a  lead pencil on rt side of face and also on rt upper arm.

## 2017-01-15 NOTE — Progress Notes (Signed)
PROGRESS NOTE [facility]   Christine Lyons   LMB:[id number] DOB: 14-Jan-1925  DOA: 01/14/2017 PCP: Javier Glazier, MD   Brief Narrative:  83 year old female with medical history of hypertension and hyperlipidemia presented to the emergency department after she was found on the floor by nursing staff at her facility. Daughter reports that she was confused and was having difficulty getting out words. Patient was confusing the ED and did not contribute to the history but denied any weakness, dizziness, chest pain, shortness of breath and palpitations. Patient was admitted for TIA/stroke workup which so far has been negative. MRI shows some mild communicating hydrocephalus results were discussed with neurologist who then recommended any further workup at this point. Patient is back to baseline. Will monitor overnight and continued to be stable will discharge in the morning.  Subjective: Patient seen and examined she is complaining of a headache and abdominal pain. She states "that she has live long enough and now is her time". Patient surrounded by family member who reported that she always stayed the same comments.  Assessment & Plan: TIA - MRI negative. Concerning for migraine headaches with neurological symptoms headache improved with NSAIDs - symptoms improved and patient is now back to baseline Echocardiogram normal MRI of the brain negative for stroke, with some decrease of superimposed communicating hydrocephalus and atrophy with ventriculomegaly. No further workup is necessary case discussed with neurologist.  Hypertension Initially was evaluated permissive hypertension now the MRI is negative full resume home medications Continue Norvasc and losartan  Elevated troponin EKG with no ischemic changes, troponins trending down will check another troponin in the morning. Echo with no wall motion abnormality, EF is normal and shows grade 1 diastolic dysfunction Patient not candidate  for aggressive cardiac evaluation, family agrees and does not wish to pursue further workup.  Rhabdomyolysis - mild Likely due to fall and laying down on the floor for a period of time Patient on IV fluid recheck CK in the morning  Acute encephalopathy unclear etiology has resolved.   DVT prophylaxis: Lovenox Code Status: DO NOT RESUSCITATE Family Communication: Family at bedside Disposition Plan: Home in the morning.  Consultants:   None   Procedures:   ECHO 01/15/17 ------------------------------------------------------------------- Study Conclusions  - Left ventricle: The cavity size was normal. There was moderate   focal basal and mild concentric hypertrophy of the septum.   Systolic function was normal. The estimated ejection fraction was   in the range of 60% to 65%. Wall motion was normal; there were no   regional wall motion abnormalities. Doppler parameters are   consistent with abnormal left ventricular relaxation (grade 1   diastolic dysfunction). The E/e&' ratio is between 8-15,   suggesting indeterminate LV filling pressure. - Mitral valve: Mildly thickened leaflets . There was mild   regurgitation. - Left atrium: The atrium was mildly dilated. - Tricuspid valve: There was trivial regurgitation. - Pulmonary arteries: PA peak pressure: 28 mm Hg (S). - Inferior vena cava: The vessel was normal in size. The   respirophasic diameter changes were in the normal range (>= 50%),   consistent with normal central venous pressure.  Impressions:  - LVEF 60-65%, mild LVH with moderate focal basal septal   hypertrophy, normal wall motion, grade 1 DD with indeterminate LV   filling pressure, mild MR, mild LAE, trivial TR, RVSP 28 mmHg,   normal IVC.  Antimicrobials: Anti-infectives    None       Objective: Vitals:   01/15/17 0537 01/15/17  0900 01/15/17 1300 01/15/17 1500  BP: (!) 138/58 (!) 167/69 (!) 160/64 (!) 160/73  Pulse: 76 60 64 68  Resp: 18  16 16     Temp: 99 F (37.2 C) 97.6 F (36.4 C) 98.4 F (36.9 C) 99.1 F (37.3 C)  TempSrc: Oral Oral Oral Oral  SpO2: 96% 98% 95% 94%  Weight:      Height:        Intake/Output Summary (Last 24 hours) at 01/15/17 1645 Last data filed at 01/15/17 1431  Gross per 24 hour  Intake               50 ml  Output                0 ml  Net               50 ml   Filed Weights   01/14/17 1211 01/14/17 1733  Weight: 54.4 kg (120 lb) 58.1 kg (128 lb 1.4 oz)    Examination:  General exam: Appears calm and comfortable  HEENT: AC/AT, PERRLA, OP moist and clear Respiratory system: Clear to auscultation. No wheezes,crackle or rhonchi Cardiovascular system: S1 & S2 heard, RRR. No JVD, murmurs, rubs or gallops Gastrointestinal system: Abdomen is nondistended, soft and nontender. No organomegaly or masses felt. Normal bowel sounds heard. Central nervous system: Alert and oriented. Decrease sensation on the left r side of the face, otherwise non focal  Extremities: No pedal edema. Symmetric, strength 5/5   Skin: No rashes, lesions or ulcers Psychiatry: Judgement and insight appear normal. Mood & affect appropriate.    Data Reviewed: I have personally reviewed following labs and imaging studies  CBC:  Recent Labs Lab 01/14/17 1331  WBC 13.1*  NEUTROABS 11.6*  HGB 11.9*  HCT 35.1*  MCV 99.7  PLT 903   Basic Metabolic Panel:  Recent Labs Lab 01/14/17 1331  NA 134*  K 3.6  CL 98*  CO2 24  GLUCOSE 145*  BUN 13  CREATININE 0.59  CALCIUM 9.0   GFR: Estimated Creatinine Clearance: 38.3 mL/min (by C-G formula based on SCr of 0.59 mg/dL). Liver Function Tests:  Recent Labs Lab 01/14/17 1331  AST 34  ALT 14  ALKPHOS 37*  BILITOT 0.8  PROT 7.0  ALBUMIN 4.5    Recent Labs Lab 01/14/17 1331  LIPASE 19   Cardiac Enzymes:  Recent Labs Lab 01/14/17 1331 01/14/17 1855 01/15/17 0043 01/15/17 0730  CKTOTAL 427*  --   --   --   TROPONINI  --  1.36* 1.24* 0.93*   BNP (last 3  results) No results for input(s): PROBNP in the last 8760 hours. HbA1C: No results for input(s): HGBA1C in the last 72 hours. CBG: No results for input(s): GLUCAP in the last 168 hours. Lipid Profile:  Recent Labs  01/15/17 0043  CHOL 141  HDL 62  LDLCALC 63  TRIG 79  CHOLHDL 2.3   Thyroid Function Tests: No results for input(s): TSH, T4TOTAL, FREET4, T3FREE, THYROIDAB in the last 72 hours. Anemia Panel: No results for input(s): VITAMINB12, FOLATE, FERRITIN, TIBC, IRON, RETICCTPCT in the last 72 hours. Sepsis Labs:  Recent Labs Lab 01/14/17 1343  LATICACIDVEN 1.49    No results found for this or any previous visit (from the past 240 hour(s)).    Radiology Studies: Dg Chest 2 View  Result Date: 01/14/2017 CLINICAL DATA:  Altered mental status, unwitnessed fall. EXAM: CHEST  2 VIEW COMPARISON:  08/28/2012 FINDINGS: Top normal size cardiac silhouette.  No aortic aneurysm. Mild vascular congestion without pneumonic consolidation, effusion or pneumothorax. Osteoarthritis of the [facility] and glenohumeral joints bilaterally. Numerous surgical clips project over the epigastric and left upper quadrant of the abdomen. The patient is status post T12 kyphoplasty. IMPRESSION: No acute pulmonary disease.  Mild vascular congestion. Electronically Signed   By: Ashley Royalty M.D.   On: 01/14/2017 13:43   Ct Head Wo Contrast  Result Date: 01/14/2017 CLINICAL DATA:  Altered mental status EXAM: CT HEAD WITHOUT CONTRAST TECHNIQUE: Contiguous axial images were obtained from the base of the skull through the vertex without intravenous contrast. COMPARISON:  MRI head 09/23/2016 FINDINGS: Brain: Ventricular enlargement unchanged. There is diffuse atrophy. Ventricle size slightly greater than expected for the level of atrophy raising the possibility of communicating hydrocephalus. This is stable from prior studies. Negative for acute infarct, hemorrhage, or mass lesion. Vascular: Negative for hyperdense vessel  Skull: Negative Sinuses/Orbits: Negative.  Bilateral cataract removal Other: None IMPRESSION: Moderate ventricular enlargement is stable from prior studies. This may be due to atrophy or communicating hydrocephalus. No acute intracranial abnormality. Electronically Signed   By: Franchot Gallo M.D.   On: 01/14/2017 14:28   Mr Brain Wo Contrast  Result Date: 01/15/2017 CLINICAL DATA:  Stroke.  Altered mental status. EXAM: MRI HEAD WITHOUT CONTRAST MRA HEAD WITHOUT CONTRAST TECHNIQUE: Multiplanar, multiecho pulse sequences of the brain and surrounding structures were obtained without intravenous contrast. Angiographic images of the head were obtained using MRA technique without contrast. COMPARISON:  Head CT from yesterday.  Brain MRI 09/23/2016 FINDINGS: MRI HEAD FINDINGS Brain: No acute infarction, hemorrhage, obstructive hydrocephalus, extra-axial collection or mass lesion. Atrophy with prominent lateral and third ventriculomegaly. Corpus callosum appears up lifted on sagittal T1 weighted imaging, but the callosal angle is not narrowed. Mild chronic microvascular ischemic change in the periventricular white matter, age congruent. Vascular: Arterial findings below. Normal dural venous sinus flow voids. Skull and upper cervical spine: Normal marrow signal. Cervical facet arthropathy. Sinuses/Orbits: Bilateral cataract resection.  No acute finding. MRA HEAD FINDINGS Hypoplastic right A1 segment and fetal type right PCA. No beading or noted atheromatous irregularity. No branch occlusion or stenosis. Negative for aneurysm IMPRESSION: Brain MRI: 1. No acute finding. 2. Atrophy with ventriculomegaly. There could be a degree of superimposed communicating hydrocephalus. MRA head: Normal. Electronically Signed   By: Monte Fantasia M.D.   On: 01/15/2017 15:42   Mr Jodene Nam Head/brain EG Cm  Result Date: 01/15/2017 CLINICAL DATA:  Stroke.  Altered mental status. EXAM: MRI HEAD WITHOUT CONTRAST MRA HEAD WITHOUT CONTRAST  TECHNIQUE: Multiplanar, multiecho pulse sequences of the brain and surrounding structures were obtained without intravenous contrast. Angiographic images of the head were obtained using MRA technique without contrast. COMPARISON:  Head CT from yesterday.  Brain MRI 09/23/2016 FINDINGS: MRI HEAD FINDINGS Brain: No acute infarction, hemorrhage, obstructive hydrocephalus, extra-axial collection or mass lesion. Atrophy with prominent lateral and third ventriculomegaly. Corpus callosum appears up lifted on sagittal T1 weighted imaging, but the callosal angle is not narrowed. Mild chronic microvascular ischemic change in the periventricular white matter, age congruent. Vascular: Arterial findings below. Normal dural venous sinus flow voids. Skull and upper cervical spine: Normal marrow signal. Cervical facet arthropathy. Sinuses/Orbits: Bilateral cataract resection.  No acute finding. MRA HEAD FINDINGS Hypoplastic right A1 segment and fetal type right PCA. No beading or noted atheromatous irregularity. No branch occlusion or stenosis. Negative for aneurysm IMPRESSION: Brain MRI: 1. No acute finding. 2. Atrophy with ventriculomegaly. There could be a degree of  superimposed communicating hydrocephalus. MRA head: Normal. Electronically Signed   By: Monte Fantasia M.D.   On: 01/15/2017 15:42    Scheduled Meds: .  stroke: mapping our early stages of recovery book   Does not apply Once  . aspirin EC  325 mg Oral Daily  . enoxaparin (LOVENOX) injection  40 mg Subcutaneous Q24H  . simvastatin  40 mg Oral QPM   Continuous Infusions:   LOS: 0 days    Chipper Oman, MD Pager: Text Page via [email or web address]  [phone]  If 7PM-7AM, please contact night-coverage [email or web address] Password [email or web address] 01/15/2017, 4:45 PM

## 2017-01-16 DIAGNOSIS — R748 Abnormal levels of other serum enzymes: Secondary | ICD-10-CM | POA: Diagnosis not present

## 2017-01-16 DIAGNOSIS — G43909 Migraine, unspecified, not intractable, without status migrainosus: Secondary | ICD-10-CM

## 2017-01-16 DIAGNOSIS — G459 Transient cerebral ischemic attack, unspecified: Secondary | ICD-10-CM | POA: Diagnosis not present

## 2017-01-16 DIAGNOSIS — E860 Dehydration: Secondary | ICD-10-CM | POA: Diagnosis not present

## 2017-01-16 DIAGNOSIS — G458 Other transient cerebral ischemic attacks and related syndromes: Secondary | ICD-10-CM | POA: Diagnosis not present

## 2017-01-16 DIAGNOSIS — I10 Essential (primary) hypertension: Secondary | ICD-10-CM | POA: Diagnosis not present

## 2017-01-16 LAB — BASIC METABOLIC PANEL
ANION GAP: 10 (ref 5–15)
BUN: 16 mg/dL (ref 6–20)
CALCIUM: 9.1 mg/dL (ref 8.9–10.3)
CO2: 25 mmol/L (ref 22–32)
CREATININE: 0.6 mg/dL (ref 0.44–1.00)
Chloride: 104 mmol/L (ref 101–111)
Glucose, Bld: 109 mg/dL — ABNORMAL HIGH (ref 65–99)
Potassium: 3.4 mmol/L — ABNORMAL LOW (ref 3.5–5.1)
SODIUM: 139 mmol/L (ref 135–145)

## 2017-01-16 LAB — CBC WITH DIFFERENTIAL/PLATELET
Basophils Absolute: 0 10*3/uL (ref 0.0–0.1)
Basophils Relative: 0 %
Eosinophils Absolute: 0.2 10*3/uL (ref 0.0–0.7)
Eosinophils Relative: 2 %
HEMATOCRIT: 38.7 % (ref 36.0–46.0)
Hemoglobin: 12.2 g/dL (ref 12.0–15.0)
LYMPHS ABS: 2 10*3/uL (ref 0.7–4.0)
LYMPHS PCT: 23 %
MCH: 33 pg (ref 26.0–34.0)
MCHC: 31.5 g/dL (ref 30.0–36.0)
MCV: 104.6 fL — ABNORMAL HIGH (ref 78.0–100.0)
Monocytes Absolute: 1.2 10*3/uL — ABNORMAL HIGH (ref 0.1–1.0)
Monocytes Relative: 14 %
NEUTROS PCT: 61 %
Neutro Abs: 5.4 10*3/uL (ref 1.7–7.7)
PLATELETS: 225 10*3/uL (ref 150–400)
RBC: 3.7 MIL/uL — ABNORMAL LOW (ref 3.87–5.11)
RDW: 14.6 % (ref 11.5–15.5)
WBC: 8.8 10*3/uL (ref 4.0–10.5)

## 2017-01-16 LAB — HEMOGLOBIN A1C
HEMOGLOBIN A1C: 5.2 % (ref 4.8–5.6)
MEAN PLASMA GLUCOSE: 103 mg/dL

## 2017-01-16 LAB — TROPONIN I: Troponin I: 0.79 ng/mL (ref <0.03)

## 2017-01-16 LAB — CK: Total CK: 155 U/L (ref 38–234)

## 2017-01-16 MED ORDER — ATORVASTATIN CALCIUM 20 MG PO TABS: 20.0000 mg | ORAL_TABLET | Freq: Every day | ORAL | Status: DC

## 2017-01-16 MED ORDER — LOSARTAN POTASSIUM 100 MG PO TABS: 100.0000 mg | ORAL_TABLET | Freq: Every day | ORAL | 0 refills | Status: AC

## 2017-01-16 MED ORDER — ASPIRIN EC 325 MG PO TBEC: 325.0000 mg | DELAYED_RELEASE_TABLET | Freq: Every day | ORAL | 0 refills | Status: DC

## 2017-01-16 MED ORDER — POTASSIUM CHLORIDE CRYS ER 20 MEQ PO TBCR
40.0000 meq | EXTENDED_RELEASE_TABLET | Freq: Once | ORAL | Status: AC
  Administered 2017-01-16: 40 meq via ORAL
  Filled 2017-01-16: qty 2

## 2017-01-16 MED ORDER — AMLODIPINE BESYLATE 5 MG PO TABS: 5.0000 mg | ORAL_TABLET | Freq: Every day | ORAL | 0 refills | Status: AC

## 2017-01-16 NOTE — Discharge Summary (Signed)
Physician Discharge Summary  Christine Lyons  ZJQ:[id number]  DOB: Dec 07, 1924  DOA: 01/14/2017 PCP: Javier Glazier, MD  Admit date: 01/14/2017 Discharge date: 01/16/2017  Admitted From: ALF Disposition:  ALF  Recommendations for Outpatient Follow-up:  1. Follow up with PCP in 1-2 weeks 2. Please obtain BMP/CBC in one week to monitor Hgb and Cr   Discharge Condition: Stable   CODE STATUS: DNR  Diet recommendation: Heart Healthy   Brief/Interim Summary: 83 year old female with medical history of hypertension and hyperlipidemia presented to the emergency department after she was found on the floor by nursing staff at her facility. Daughter reports that she was confused and was having difficulty getting out words. Patient was confusing the ED and did not contribute to the history but denied any weakness, dizziness, chest pain, shortness of breath and palpitations. Patient was admitted for TIA/stroke workup which so far has been negative. MRI shows some mild communicating hydrocephalus results were discussed with neurologist who then recommended any further workup at this point. Patient is back to baseline.  Subjective: Patient seen and examined with daughter at bedside. Patient ambulating with walker with no issues. Tolerating diet well. Per daughter patient is "hundred percent better". Patient with no difficulties with speech or other neurologic symptoms. Remains afebrile.   Discharge Diagnoses/Hospital Course:  Acute encephalopathy/TIA- unable to determine etiology clinically. Concerning of Migraine headaches with neurological symptoms given headaches improved with NSAIDs, headaches was one sided associated with nauseas and photophobia.  Symptoms resolved < 24 hrs  Continue Aspirin 81 mg daily  Cholesterol levels normal  Echocardiogram normal MRI of the brain negative for stroke, with some degree of superimposed communicating hydrocephalus and atrophy with ventriculomegaly. No further workup  is necessary case discussed with neurologist.  Hypertension  Initially was evaluated permissive hypertension now the MRI is negative full resume home medications Stable after initiation of home medication  Continue Norvasc and Losartan  Follow up with PCP in 1 week   Elevated troponin EKG with no ischemic changes, troponins trended down. Echo with no wall motion abnormality, EF is normal and shows grade 1 diastolic dysfunction Patient not candidate for aggressive cardiac evaluation, family agrees and does not wish to pursue further workup.  Rhabdomyolysis - resolved  Likely due to fall and laying down on the floor for a long period of time Treated with IVF   All other chronic medical condition were stable during the hospitalization.  Patient was seen by physical therapy and no further recommendations where made  On the day of the discharge the patient's vitals were stable, and no other acute medical condition were reported by patient. Patient was felt safe to be discharge to ALF  Discharge Instructions  You were cared for by a hospitalist during your hospital stay. If you have any questions about your discharge medications or the care you received while you were in the hospital after you are discharged, you can call the unit and asked to speak with the hospitalist on call if the hospitalist that took care of you is not available. Once you are discharged, your primary care physician will handle any further medical issues. Please note that NO REFILLS for any discharge medications will be authorized once you are discharged, as it is imperative that you return to your primary care physician (or establish a relationship with a primary care physician if you do not have one) for your aftercare needs so that they can reassess your need for medications and monitor your lab values.  Allergies as of 01/16/2017      Reactions   Codeine Nausea Only, Anxiety      Medication List    STOP taking  these medications   simvastatin 40 MG tablet Commonly known as:  ZOCOR     TAKE these medications   amLODipine 5 MG tablet Commonly known as:  NORVASC Take 1 tablet (5 mg total) by mouth daily.   aspirin EC 325 MG tablet Take 1 tablet (325 mg total) by mouth daily.   losartan 100 MG tablet Commonly known as:  COZAAR Take 1 tablet (100 mg total) by mouth daily.      Follow-up Information    Javier Glazier, MD. Schedule an appointment as soon as possible for a visit in 1 week(s).   Specialty:  Internal Medicine Why:  Follow up hospitalization          Allergies  Allergen Reactions  . Codeine Nausea Only and Anxiety    Consultations:  None    Procedures/Studies: Dg Chest 2 View  Result Date: 01/14/2017 CLINICAL DATA:  Altered mental status, unwitnessed fall. EXAM: CHEST  2 VIEW COMPARISON:  08/28/2012 FINDINGS: Top normal size cardiac silhouette. No aortic aneurysm. Mild vascular congestion without pneumonic consolidation, effusion or pneumothorax. Osteoarthritis of the [facility] and glenohumeral joints bilaterally. Numerous surgical clips project over the epigastric and left upper quadrant of the abdomen. The patient is status post T12 kyphoplasty. IMPRESSION: No acute pulmonary disease.  Mild vascular congestion. Electronically Signed   By: Ashley Royalty M.D.   On: 01/14/2017 13:43   Ct Head Wo Contrast  Result Date: 01/14/2017 CLINICAL DATA:  Altered mental status EXAM: CT HEAD WITHOUT CONTRAST TECHNIQUE: Contiguous axial images were obtained from the base of the skull through the vertex without intravenous contrast. COMPARISON:  MRI head 09/23/2016 FINDINGS: Brain: Ventricular enlargement unchanged. There is diffuse atrophy. Ventricle size slightly greater than expected for the level of atrophy raising the possibility of communicating hydrocephalus. This is stable from prior studies. Negative for acute infarct, hemorrhage, or mass lesion. Vascular: Negative for hyperdense vessel  Skull: Negative Sinuses/Orbits: Negative.  Bilateral cataract removal Other: None IMPRESSION: Moderate ventricular enlargement is stable from prior studies. This may be due to atrophy or communicating hydrocephalus. No acute intracranial abnormality. Electronically Signed   By: Franchot Gallo M.D.   On: 01/14/2017 14:28   Mr Brain Wo Contrast  Result Date: 01/15/2017 CLINICAL DATA:  Stroke.  Altered mental status. EXAM: MRI HEAD WITHOUT CONTRAST MRA HEAD WITHOUT CONTRAST TECHNIQUE: Multiplanar, multiecho pulse sequences of the brain and surrounding structures were obtained without intravenous contrast. Angiographic images of the head were obtained using MRA technique without contrast. COMPARISON:  Head CT from yesterday.  Brain MRI 09/23/2016 FINDINGS: MRI HEAD FINDINGS Brain: No acute infarction, hemorrhage, obstructive hydrocephalus, extra-axial collection or mass lesion. Atrophy with prominent lateral and third ventriculomegaly. Corpus callosum appears up lifted on sagittal T1 weighted imaging, but the callosal angle is not narrowed. Mild chronic microvascular ischemic change in the periventricular white matter, age congruent. Vascular: Arterial findings below. Normal dural venous sinus flow voids. Skull and upper cervical spine: Normal marrow signal. Cervical facet arthropathy. Sinuses/Orbits: Bilateral cataract resection.  No acute finding. MRA HEAD FINDINGS Hypoplastic right A1 segment and fetal type right PCA. No beading or noted atheromatous irregularity. No branch occlusion or stenosis. Negative for aneurysm IMPRESSION: Brain MRI: 1. No acute finding. 2. Atrophy with ventriculomegaly. There could be a degree of superimposed communicating hydrocephalus. MRA head: Normal. Electronically Signed  By: Monte Fantasia M.D.   On: 01/15/2017 15:42   Mr Jodene Nam Head/brain MW Cm  Result Date: 01/15/2017 CLINICAL DATA:  Stroke.  Altered mental status. EXAM: MRI HEAD WITHOUT CONTRAST MRA HEAD WITHOUT CONTRAST  TECHNIQUE: Multiplanar, multiecho pulse sequences of the brain and surrounding structures were obtained without intravenous contrast. Angiographic images of the head were obtained using MRA technique without contrast. COMPARISON:  Head CT from yesterday.  Brain MRI 09/23/2016 FINDINGS: MRI HEAD FINDINGS Brain: No acute infarction, hemorrhage, obstructive hydrocephalus, extra-axial collection or mass lesion. Atrophy with prominent lateral and third ventriculomegaly. Corpus callosum appears up lifted on sagittal T1 weighted imaging, but the callosal angle is not narrowed. Mild chronic microvascular ischemic change in the periventricular white matter, age congruent. Vascular: Arterial findings below. Normal dural venous sinus flow voids. Skull and upper cervical spine: Normal marrow signal. Cervical facet arthropathy. Sinuses/Orbits: Bilateral cataract resection.  No acute finding. MRA HEAD FINDINGS Hypoplastic right A1 segment and fetal type right PCA. No beading or noted atheromatous irregularity. No branch occlusion or stenosis. Negative for aneurysm IMPRESSION: Brain MRI: 1. No acute finding. 2. Atrophy with ventriculomegaly. There could be a degree of superimposed communicating hydrocephalus. MRA head: Normal. Electronically Signed   By: Monte Fantasia M.D.   On: 01/15/2017 15:42   ECHO 01/15/2017 ------------------------------------------------------------------- Study Conclusions  - Left ventricle: The cavity size was normal. There was moderate   focal basal and mild concentric hypertrophy of the septum.   Systolic function was normal. The estimated ejection fraction was   in the range of 60% to 65%. Wall motion was normal; there were no   regional wall motion abnormalities. Doppler parameters are   consistent with abnormal left ventricular relaxation (grade 1   diastolic dysfunction). The E/e&' ratio is between 8-15,   suggesting indeterminate LV filling pressure. - Mitral valve: Mildly thickened  leaflets . There was mild   regurgitation. - Left atrium: The atrium was mildly dilated. - Tricuspid valve: There was trivial regurgitation. - Pulmonary arteries: PA peak pressure: 28 mm Hg (S). - Inferior vena cava: The vessel was normal in size. The   respirophasic diameter changes were in the normal range (>= 50%),   consistent with normal central venous pressure.  Impressions:  - LVEF 60-65%, mild LVH with moderate focal basal septal   hypertrophy, normal wall motion, grade 1 DD with indeterminate LV   filling pressure, mild MR, mild LAE, trivial TR, RVSP 28 mmHg,   normal IVC.  Discharge Exam: Vitals:   01/16/17 0516 01/16/17 1043  BP: (!) 169/63 128/61  Pulse: 67   Resp: 16   Temp: 98.4 F (36.9 C)    Vitals:   01/15/17 1710 01/15/17 2021 01/16/17 0516 01/16/17 1043  BP: (!) 113/50 (!) 148/63 (!) 169/63 128/61  Pulse:  63 67   Resp:  16 16   Temp:  97.9 F (36.6 C) 98.4 F (36.9 C)   TempSrc:  Oral Oral   SpO2:  98% 97%   Weight:      Height:        General: Pt is alert, awake, not in acute distress Cardiovascular: RRR, S1/S2 +, no rubs, no gallops Respiratory: CTA bilaterally, no wheezing, no rhonchi Abdominal: Soft, NT, ND, bowel sounds + Extremities: no edema, no cyanosis   The results of significant diagnostics from this hospitalization (including imaging, microbiology, ancillary and laboratory) are listed below for reference.     Labs: Basic Metabolic Panel:  Recent Labs Lab 01/14/17 1331  01/16/17 0504  NA 134* 139  K 3.6 3.4*  CL 98* 104  CO2 24 25  GLUCOSE 145* 109*  BUN 13 16  CREATININE 0.59 0.60  CALCIUM 9.0 9.1   Liver Function Tests:  Recent Labs Lab 01/14/17 1331  AST 34  ALT 14  ALKPHOS 37*  BILITOT 0.8  PROT 7.0  ALBUMIN 4.5    Recent Labs Lab 01/14/17 1331  LIPASE 19   CBC:  Recent Labs Lab 01/14/17 1331 01/16/17 0504  WBC 13.1* 8.8  NEUTROABS 11.6* 5.4  HGB 11.9* 12.2  HCT 35.1* 38.7  MCV 99.7 104.6*   PLT 232 225   Cardiac Enzymes:  Recent Labs Lab 01/14/17 1331 01/14/17 1855 01/15/17 0043 01/15/17 0730 01/16/17 0504  CKTOTAL 427*  --   --   --  155  TROPONINI  --  1.36* 1.24* 0.93* 0.79*   Lipid Profile  Recent Labs  01/15/17 0043  CHOL 141  HDL 62  LDLCALC 63  TRIG 79  CHOLHDL 2.3   Thyroid function studies No results for input(s): TSH, T4TOTAL, T3FREE, THYROIDAB in the last 72 hours.  Invalid input(s): FREET3 Anemia work up No results for input(s): VITAMINB12, FOLATE, FERRITIN, TIBC, IRON, RETICCTPCT in the last 72 hours. Urinalysis    Component Value Date/Time   COLORURINE YELLOW 01/14/2017 1430   APPEARANCEUR CLEAR 01/14/2017 1430   LABSPEC 1.014 01/14/2017 1430   PHURINE 7.0 01/14/2017 1430   GLUCOSEU 50 (A) 01/14/2017 1430   HGBUR MODERATE (A) 01/14/2017 1430   BILIRUBINUR NEGATIVE 01/14/2017 1430   KETONESUR 20 (A) 01/14/2017 1430   PROTEINUR 100 (A) 01/14/2017 1430   UROBILINOGEN 0.2 08/28/2012 1453   NITRITE NEGATIVE 01/14/2017 1430   LEUKOCYTESUR NEGATIVE 01/14/2017 1430   Sepsis Labs Invalid input(s): PROCALCITONIN,  WBC,  LACTICIDVEN Microbiology No results found for this or any previous visit (from the past 240 hour(s)).   Time coordinating discharge: 25 minutes  SIGNED:  Chipper Oman, MD  [facility] 01/16/2017, 10:46 AM  Pager please text page via  [email or web address] Password [email or web address]

## 2017-01-16 NOTE — Progress Notes (Signed)
OT Cancellation Note  Patient Details Name: Christine Lyons MRN: [id number] DOB: December 07, 1924   Cancelled Treatment:    Reason Eval/Treat Not Completed: Medical issues which prohibited therapy (troponin 0.79)  Sheila Oats Ignatz Deis OTR/L 01/16/2017, 10:56 AM

## 2019-07-07 ENCOUNTER — Other Ambulatory Visit: Payer: Self-pay

## 2019-07-07 ENCOUNTER — Emergency Department (HOSPITAL_COMMUNITY): Payer: Medicare Other

## 2019-07-07 ENCOUNTER — Emergency Department (HOSPITAL_COMMUNITY)
Admission: EM | Admit: 2019-07-07 | Discharge: 2019-07-08 | Disposition: A | Discharge status: Home / Self Care | Payer: Medicare Other | Attending: Emergency Medicine | Admitting: Emergency Medicine

## 2019-07-07 ENCOUNTER — Encounter (HOSPITAL_COMMUNITY): Payer: Self-pay

## 2019-07-07 DIAGNOSIS — R4701 Aphasia: Secondary | ICD-10-CM | POA: Diagnosis present

## 2019-07-07 DIAGNOSIS — Z20828 Contact with and (suspected) exposure to other viral communicable diseases: Secondary | ICD-10-CM | POA: Insufficient documentation

## 2019-07-07 DIAGNOSIS — I10 Essential (primary) hypertension: Secondary | ICD-10-CM | POA: Diagnosis not present

## 2019-07-07 DIAGNOSIS — G459 Transient cerebral ischemic attack, unspecified: Secondary | ICD-10-CM

## 2019-07-07 DIAGNOSIS — N3001 Acute cystitis with hematuria: Secondary | ICD-10-CM | POA: Diagnosis not present

## 2019-07-07 DIAGNOSIS — Z8673 Personal history of transient ischemic attack (TIA), and cerebral infarction without residual deficits: Secondary | ICD-10-CM | POA: Diagnosis not present

## 2019-07-07 LAB — CBC WITH DIFFERENTIAL/PLATELET
Abs Immature Granulocytes: 0.02 10*3/uL (ref 0.00–0.07)
Basophils Absolute: 0.1 10*3/uL (ref 0.0–0.1)
Basophils Relative: 1 %
Eosinophils Absolute: 0.1 10*3/uL (ref 0.0–0.5)
Eosinophils Relative: 1 %
HCT: 39.1 % (ref 36.0–46.0)
Hemoglobin: 12.8 g/dL (ref 12.0–15.0)
Immature Granulocytes: 0 %
Lymphocytes Relative: 26 %
Lymphs Abs: 1.8 10*3/uL (ref 0.7–4.0)
MCH: 33.7 pg (ref 26.0–34.0)
MCHC: 32.7 g/dL (ref 30.0–36.0)
MCV: 102.9 fL — ABNORMAL HIGH (ref 80.0–100.0)
Monocytes Absolute: 0.5 10*3/uL (ref 0.1–1.0)
Monocytes Relative: 8 %
Neutro Abs: 4.4 10*3/uL (ref 1.7–7.7)
Neutrophils Relative %: 64 %
Platelets: 120 10*3/uL — ABNORMAL LOW (ref 150–400)
RBC: 3.8 MIL/uL — ABNORMAL LOW (ref 3.87–5.11)
RDW: 14.8 % (ref 11.5–15.5)
WBC: 6.9 10*3/uL (ref 4.0–10.5)
nRBC: 0 % (ref 0.0–0.2)

## 2019-07-07 LAB — URINALYSIS, ROUTINE W REFLEX MICROSCOPIC
Bilirubin Urine: NEGATIVE
Glucose, UA: NEGATIVE mg/dL
Hgb urine dipstick: NEGATIVE
Ketones, ur: 20 mg/dL — AB
Nitrite: NEGATIVE
Protein, ur: NEGATIVE mg/dL
Specific Gravity, Urine: 1.019 (ref 1.005–1.030)
pH: 5 (ref 5.0–8.0)

## 2019-07-07 LAB — COMPREHENSIVE METABOLIC PANEL
ALT: 9 U/L (ref 0–44)
AST: 19 U/L (ref 15–41)
Albumin: 4.2 g/dL (ref 3.5–5.0)
Alkaline Phosphatase: 51 U/L (ref 38–126)
Anion gap: 13 (ref 5–15)
BUN: 17 mg/dL (ref 8–23)
CO2: 18 mmol/L — ABNORMAL LOW (ref 22–32)
Calcium: 9 mg/dL (ref 8.9–10.3)
Chloride: 108 mmol/L (ref 98–111)
Creatinine, Ser: 1.01 mg/dL — ABNORMAL HIGH (ref 0.44–1.00)
GFR calc Af Amer: 56 mL/min — ABNORMAL LOW (ref >60)
GFR calc non Af Amer: 48 mL/min — ABNORMAL LOW (ref >60)
Glucose, Bld: 110 mg/dL — ABNORMAL HIGH (ref 70–99)
Potassium: 4.3 mmol/L (ref 3.5–5.1)
Sodium: 139 mmol/L (ref 135–145)
Total Bilirubin: 1 mg/dL (ref 0.3–1.2)
Total Protein: 6.6 g/dL (ref 6.5–8.1)

## 2019-07-07 MED ORDER — LACTATED RINGERS IV BOLUS: 500.0000 mL | Freq: Once | INTRAVENOUS | Status: DC

## 2019-07-07 MED ORDER — SODIUM CHLORIDE 0.9 % IV BOLUS
1000.0000 mL | Freq: Once | INTRAVENOUS | Status: AC
  Administered 2019-07-07: 1000 mL via INTRAVENOUS

## 2019-07-07 MED ORDER — SODIUM CHLORIDE 0.9 % IV SOLN
1.0000 g | Freq: Once | INTRAVENOUS | Status: AC
  Administered 2019-07-07: 1 g via INTRAVENOUS
  Filled 2019-07-07: qty 10

## 2019-07-07 MED ORDER — CEPHALEXIN 500 MG PO CAPS: 500.0000 mg | ORAL_CAPSULE | Freq: Two times a day (BID) | ORAL | 0 refills | Status: AC

## 2019-07-07 NOTE — Discharge Instructions (Addendum)
We did not find any evidence of stroke on your CT today.  We did find a few notable lab findings.  You were found to have a urinary tract infection for which you were given 1 dose of IV ceftriaxone (an antibiotic) which should cover you for about 24 hours. She should take her first pill of Keflex on .  We will send you home with oral antibiotics.  Please continue to take these until the end of the course.  You also were found to have a mild acute kidney injury.  These can be caused by dehydration, medications, but was very mild and does not indicate any need for hospitalization at this time.  Please continue to stay hydrated as this will be the best for your kidneys. If you experience any further confusion, fevers, loss of consciousness, please return to the ED for further care. Please continue to follow with your PCP for any nonemergent issues and chronic problem  management.

## 2019-07-07 NOTE — ED Notes (Signed)
Pt to CT

## 2019-07-07 NOTE — ED Triage Notes (Signed)
Patient arrives via EMS due to having new onset expressive aphasia. Per ems, patient was talking to her daughter today on the phone and the daughter noted that the patient was not making sense. Patient was seen in her neighborhood walking her dog around 1100, but no one spoke to her at that time. LKW is unknown.    Per ems, patient has had a previous stroke before.

## 2019-07-07 NOTE — ED Provider Notes (Signed)
[facility] EMERGENCY DEPARTMENT Provider Note   CSN: [id number] Arrival date & time: 07/07/19  1723  History    Chief Complaint  Patient presents with   Aphasia   Fever   HPI Christine Lyons is a 85 y.o. female with PMHx s/f TIA, hypertension, falls, hip fracture, who presents to the ED with aphasia and memory loss. Patient reports that she does not remember what had happened today. She vaguely remembers EMS bringing her to the hospital. She cannot remember walking her dog earlier today.  Given patient's memory loss from earlier today, called patient's daughter who is listed as emergency contact for collateral information.  Patient's daughter reports that she called her mom today around 4:00 when patient was supposed to be at church.  Patient seemed confused on the phone and seemed like she was having trouble finding words.  She did not have any word salad or difficulty understanding.  At this point, daughter called security who called EMS to have her brought to the hospital.  Daughter reports that mom has had previous TIA type episodes previously.  Most recently, 3 to 4 weeks ago where she was a little bit confused for several hours but had improvement the next day.  Daughter reports that she was not brought to the hospital as she had improved.  Patient had hospitalization in 2018 for TIA as well.  has Hyperlipidemia; GERD; SPINAL STENOSIS, LUMBAR; LOW BACK PAIN SYNDROME; OSTEOPENIA; SKIN CANCER, HX OF; TRANSIENT ISCHEMIC ATTACK, HX OF; DIVERTICULITIS, HX OF; Hearing loss; Hip fracture ([place]); Bradycardia; HTN (hypertension); Hypotension; TIA (transient ischemic attack); Basal cell carcinoma of ala nasi; Bone/cartilage disorder; Fall in home; At risk for falling; and Aphasia on their problem list.  Allergies: Codeine Medications:  Current Outpatient Medications  Medication Instructions   amLODipine (NORVASC) 5 mg, Oral, Daily   cephALEXin (KEFLEX) 500 mg, Oral, 2 times  daily   losartan (COZAAR) 100 mg, Oral, Daily    Past Medical/Surgical History Past Medical History:  Diagnosis Date   Bradycardia    Diverticulitis    Esophageal reflux    GERD (gastroesophageal reflux disease)    Hip fracture ([place])    left, Dr. Alvan Dame   Hx of echocardiogram    a. Echo 09/08/12: EF [id number], grade 1 diastolic dysfunction.   Hyperlipidemia    Hypertension    Osteopenia    Personal history of other malignant neoplasm of skin    Spinal stenosis    Stroke ([place]) 1999   TIA in [place]   Unspecified hearing loss     Patient Active Problem List   Diagnosis Date Noted   Aphasia    Fall in home 12/24/2015   At risk for falling 12/24/2015   Basal cell carcinoma of ala nasi 04/26/2014   HTN (hypertension) 08/30/2012   Hypotension 08/30/2012   TIA (transient ischemic attack) 08/30/2012   Bradycardia 08/29/2012   Hip fracture ([place]) 08/28/2012   Hearing loss 11/09/2010   SPINAL STENOSIS, LUMBAR 03/23/2010   LOW BACK PAIN SYNDROME 03/23/2010   Hyperlipidemia 08/27/2009   GERD 08/27/2009   OSTEOPENIA 08/27/2009   SKIN CANCER, HX OF 08/27/2009   TRANSIENT ISCHEMIC ATTACK, HX OF 08/27/2009   DIVERTICULITIS, HX OF 08/27/2009   Bone/cartilage disorder 08/27/2009    Past Surgical History:  Procedure Laterality Date   ABDOMINAL HYSTERECTOMY     with BSO for dysfunctional menses   APPENDECTOMY     CHOLECYSTECTOMY     colonscopy     diverticulosis  G 7 P 6     HIP ARTHROPLASTY  08/29/2012   Procedure: ARTHROPLASTY BIPOLAR HIP;  Surgeon: Mauri Pole, MD;  Location: [facility];  Service: Orthopedics;  Laterality: Left;  LEFT HIP HEMIARTHROPLASTY   LAPAROSCOPIC GASTRIC BANDING     to prevent GERD   TONSILLECTOMY AND ADENOIDECTOMY      OB History  No obstetric history on file.   Family History  Problem Relation Age of Onset   Hypertension Mother    Dementia Father    Heart disease Brother        died of an MI, late 22s    Social History   Tobacco Use   Smoking status: Never Smoker   Smokeless tobacco: Never Used  Substance Use Topics   Alcohol use: Yes    Alcohol/week: 5.0 standard drinks    Types: 5 Standard drinks or equivalent per week    Comment: 1-2 glasses of wine most nights   Drug use: No   Review of Systems Review of Systems  Constitutional: Negative for chills and fever.  HENT: Negative for congestion, ear pain, rhinorrhea, sinus pressure, sneezing and sore throat.   Eyes: Negative for pain and visual disturbance.  Respiratory: Negative for cough and shortness of breath.   Cardiovascular: Negative for chest pain and palpitations.  Gastrointestinal: Negative for abdominal pain, constipation, diarrhea, nausea and vomiting.  Genitourinary: Negative for difficulty urinating, dysuria and hematuria.  Musculoskeletal: Negative for arthralgias and back pain.  Skin: Negative for color change and rash.  Neurological: Positive for dizziness and speech difficulty. Negative for tremors, seizures, syncope and headaches.       Dizziness when standing up  All other systems reviewed and are negative.   Physical Exam Updated Vital Signs BP (!) 155/100 (BP Location: Right Arm)    Pulse 66    Temp 98.1 F (36.7 C) (Oral)    Resp 15    Ht 5\' 2"  (1.575 m)    Wt 54.4 kg    SpO2 99%    BMI 21.95 kg/m   Physical Exam Vitals signs and nursing note reviewed.  Constitutional:      General: She is not in acute distress.    Appearance: Normal appearance. She is well-developed and normal weight. She is not ill-appearing.  HENT:     Head: Normocephalic and atraumatic.  Eyes:     Conjunctiva/sclera: Conjunctivae normal.  Neck:     Musculoskeletal: Neck supple.  Cardiovascular:     Rate and Rhythm: Normal rate and regular rhythm.     Heart sounds: No murmur.  Pulmonary:     Effort: Pulmonary effort is normal. No respiratory distress.     Breath sounds: Normal breath sounds.  Abdominal:     General:  There is no distension.     Palpations: Abdomen is soft.     Tenderness: There is no abdominal tenderness.  Musculoskeletal: Normal range of motion.     Right lower leg: No edema.     Left lower leg: No edema.  Skin:    General: Skin is warm and dry.     Capillary Refill: Capillary refill takes less than 2 seconds.  Neurological:     General: No focal deficit present.     Mental Status: She is alert and oriented to person, place, and time.  Psychiatric:        Mood and Affect: Mood normal.        Behavior: Behavior normal.  Thought Content: Thought content normal.        Judgment: Judgment normal.     ED Treatments / Results  Labs (all labs ordered are listed, but only abnormal results are displayed) Labs Reviewed  URINALYSIS, ROUTINE W REFLEX MICROSCOPIC - Abnormal; Notable for the following components:      Result Value   Color, Urine AMBER (*)    APPearance CLOUDY (*)    Ketones, ur 20 (*)    Leukocytes,Ua LARGE (*)    Bacteria, UA RARE (*)    Non Squamous Epithelial 0-5 (*)    All other components within normal limits  COMPREHENSIVE METABOLIC PANEL - Abnormal; Notable for the following components:   CO2 18 (*)    Glucose, Bld 110 (*)    Creatinine, Ser 1.01 (*)    GFR calc non Af Amer 48 (*)    GFR calc Af Amer 56 (*)    All other components within normal limits  CBC WITH DIFFERENTIAL/PLATELET - Abnormal; Notable for the following components:   RBC 3.80 (*)    MCV 102.9 (*)    Platelets 120 (*)    All other components within normal limits  URINE CULTURE  CULTURE, BLOOD (ROUTINE X 2)  CULTURE, BLOOD (ROUTINE X 2)  SARS CORONAVIRUS 2 (TAT 6-24 HRS)    EKG None  Radiology Ct Head Wo Contrast  Result Date: 07/07/2019 CLINICAL DATA:  Speech difficulty EXAM: CT HEAD WITHOUT CONTRAST TECHNIQUE: Contiguous axial images were obtained from the base of the skull through the vertex without intravenous contrast. COMPARISON:  01/14/2017 FINDINGS: Brain: No  evidence of acute infarction, hemorrhage, hydrocephalus, extra-axial collection or mass lesion/mass effect. Global volume loss and enlargement of the ventricles ex vacuo. Vascular: No hyperdense vessel or unexpected calcification. Skull: Normal. Negative for fracture or focal lesion. Sinuses/Orbits: No acute finding. Other: None. IMPRESSION: No acute intracranial pathology. Global volume loss in keeping with advanced patient age. Electronically Signed   By: Eddie Candle M.D.   On: 07/07/2019 20:11    Procedures Procedures (including critical care time)  Medications Ordered in ED Medications  cefTRIAXone (ROCEPHIN) 1 g in sodium chloride 0.9 % 100 mL IVPB (1 g Intravenous New Bag/Given 07/07/19 2235)  lactated ringers bolus 500 mL (500 mLs Intravenous Not Given 07/07/19 2227)  sodium chloride 0.9 % bolus 1,000 mL (1,000 mLs Intravenous New Bag/Given 07/07/19 2241)    Initial Impression / Assessment and Plan / ED Course  I have reviewed the triage vital signs and the nursing notes. Pertinent labs & imaging results that were available during my care of the patient were reviewed by me and considered in my medical decision making (see chart for details). 85 year old female with past medical history significant for hypertension, previous TIA presents with TIA-like symptoms and also a low-grade fever.  Daughter reports that patient had difficulty with word finding earlier today and seems very slow and confused when talking on the phone.  Patient reports not remembering much from the day.  She can barely remember EMS bring her to the hospital.  On physical exam, patient has no FND or CN deficits. She has some difficulty finding words, but nothing out of the ordinary for a 85 year old female. Most concerning is patient's memory loss of the day. Will obtain CT to rule out intracranial process. Patient seems back to her baseline (per conversation and description of patient to daughter).  For fever, patient  denies any symptoms at this time. Daughter reports that patient was  very tired yesterday and spent most of the day sleeping. Will also obtain baseline labs, UA, CXR to rule out infection. Patient is not septic and fever has subsided with no anti-pyretics.  CT negative for any acute findings.  I believe there is little use for MRI at this time as it would not change management of this patient.  Treatment would be secondary prevention which she is already taking amlodipine, losartan, ASA every other day.  For episode today, cannot rule out TIA but would unlikely to find exact cause of confusion today at this time.   UTI noted on UA. Sent for culture. Start 1 dose CTX in ED.  Mild AKI noted on BMP.  Will provide 1L NS  Given no other evidence for need for hospitalization at this time, patient will likely be discharged home with PO keflex.  Patient should have creatinine repeated within the week to ensure improvement. If she experiences any fevers, further confusion, she should return to the ED for further care.   Spoke with patient's daughter over the phone and she is agreeable with plan as stated above.    Final Clinical Impressions(s) / ED Diagnoses   Final diagnoses:  Aphasia  Acute cystitis with hematuria  TIA (transient ischemic attack)   ED Discharge Orders         Ordered    cephALEXin (KEFLEX) 500 MG capsule  2 times daily     07/07/19 2244         Disposition: home w/ f/u w/ PCP  Wilber Oliphant, M.D. FM PGY-2      Wilber Oliphant, MD 07/07/19 [place]    Elnora Morrison, MD 07/07/19 2350

## 2019-07-07 NOTE — ED Notes (Signed)
Ann Daughter, would like to be called with updates and results. Daughter would like to know plan of care.  [phone]

## 2019-07-07 NOTE — ED Notes (Signed)
Pt daughter Junie Panning [phone]

## 2019-07-08 LAB — SARS CORONAVIRUS 2 (TAT 6-24 HRS): SARS Coronavirus 2: NEGATIVE

## 2019-07-09 LAB — URINE CULTURE

## 2019-07-12 LAB — CULTURE, BLOOD (ROUTINE X 2)
Culture: NO GROWTH
Culture: NO GROWTH
Special Requests: ADEQUATE

## 2022-03-11 ENCOUNTER — Emergency Department (HOSPITAL_COMMUNITY): Payer: Medicare Other

## 2022-03-11 ENCOUNTER — Emergency Department (HOSPITAL_COMMUNITY)
Admission: EM | Admit: 2022-03-11 | Discharge: 2022-03-11 | Disposition: A | Discharge status: Home / Self Care | Payer: Medicare Other | Attending: Emergency Medicine | Admitting: Emergency Medicine

## 2022-03-11 ENCOUNTER — Encounter (HOSPITAL_COMMUNITY): Payer: Self-pay | Admitting: Emergency Medicine

## 2022-03-11 ENCOUNTER — Other Ambulatory Visit: Payer: Self-pay

## 2022-03-11 DIAGNOSIS — W1839XA Other fall on same level, initial encounter: Secondary | ICD-10-CM | POA: Diagnosis not present

## 2022-03-11 DIAGNOSIS — M25552 Pain in left hip: Secondary | ICD-10-CM | POA: Diagnosis present

## 2022-03-11 DIAGNOSIS — W19XXXA Unspecified fall, initial encounter: Secondary | ICD-10-CM

## 2022-03-11 DIAGNOSIS — Z96649 Presence of unspecified artificial hip joint: Secondary | ICD-10-CM

## 2022-03-11 LAB — URINALYSIS, ROUTINE W REFLEX MICROSCOPIC
Bilirubin Urine: NEGATIVE
Glucose, UA: NEGATIVE mg/dL
Hgb urine dipstick: NEGATIVE
Ketones, ur: NEGATIVE mg/dL
Leukocytes,Ua: NEGATIVE
Nitrite: NEGATIVE
Protein, ur: NEGATIVE mg/dL
Specific Gravity, Urine: 1.009 (ref 1.005–1.030)
pH: 6 (ref 5.0–8.0)

## 2022-03-11 LAB — BASIC METABOLIC PANEL
Anion gap: 7 (ref 5–15)
BUN: 23 mg/dL (ref 8–23)
CO2: 26 mmol/L (ref 22–32)
Calcium: 9.1 mg/dL (ref 8.9–10.3)
Chloride: 108 mmol/L (ref 98–111)
Creatinine, Ser: 0.91 mg/dL (ref 0.44–1.00)
GFR, Estimated: 58 mL/min — ABNORMAL LOW (ref >60)
Glucose, Bld: 125 mg/dL — ABNORMAL HIGH (ref 70–99)
Potassium: 4.1 mmol/L (ref 3.5–5.1)
Sodium: 141 mmol/L (ref 135–145)

## 2022-03-11 LAB — CBC WITH DIFFERENTIAL/PLATELET
Abs Immature Granulocytes: 0.04 10*3/uL (ref 0.00–0.07)
Basophils Absolute: 0 10*3/uL (ref 0.0–0.1)
Basophils Relative: 0 %
Eosinophils Absolute: 0.1 10*3/uL (ref 0.0–0.5)
Eosinophils Relative: 1 %
HCT: 34.5 % — ABNORMAL LOW (ref 36.0–46.0)
Hemoglobin: 11.1 g/dL — ABNORMAL LOW (ref 12.0–15.0)
Immature Granulocytes: 0 %
Lymphocytes Relative: 16 %
Lymphs Abs: 1.7 10*3/uL (ref 0.7–4.0)
MCH: 29.6 pg (ref 26.0–34.0)
MCHC: 32.2 g/dL (ref 30.0–36.0)
MCV: 92 fL (ref 80.0–100.0)
Monocytes Absolute: 1.4 10*3/uL — ABNORMAL HIGH (ref 0.1–1.0)
Monocytes Relative: 13 %
Neutro Abs: 7.3 10*3/uL (ref 1.7–7.7)
Neutrophils Relative %: 70 %
Platelets: 280 10*3/uL (ref 150–400)
RBC: 3.75 MIL/uL — ABNORMAL LOW (ref 3.87–5.11)
RDW: 17.9 % — ABNORMAL HIGH (ref 11.5–15.5)
WBC: 10.5 10*3/uL (ref 4.0–10.5)
nRBC: 0 % (ref 0.0–0.2)

## 2022-03-11 MED ORDER — LACTATED RINGERS IV SOLN: INTRAVENOUS | Status: DC

## 2022-03-11 MED ORDER — HYDROCODONE-ACETAMINOPHEN 5-325 MG PO TABS: 1.0000 | ORAL_TABLET | Freq: Four times a day (QID) | ORAL | 0 refills | Status: AC | PRN

## 2022-03-11 MED ORDER — HYDROCODONE-ACETAMINOPHEN 5-325 MG PO TABS
1.0000 | ORAL_TABLET | Freq: Once | ORAL | Status: AC
  Administered 2022-03-11: 1 via ORAL
  Filled 2022-03-11: qty 1

## 2022-03-11 NOTE — ED Notes (Signed)
Pt was dressed and able to stand and pivot with assistance to wheelchair.

## 2022-03-11 NOTE — ED Provider Notes (Signed)
[facility] DEPT Provider Note   CSN: [id number] Arrival date & time: 03/11/22  1345     History  Chief Complaint  Patient presents with   Hip Injury   [place] is a 88 y.o. female.  88 year old patient presents with left hip pain patient states she last fell on Monday.  Denies any head or neck injury from that.  Had x-ray done which showed an avulsion fracture of her left trochanter according to outside records from her facility.  She denies any pelvis pain.  No left knee pain.  Patient states that she normally uses a wheelchair but sometimes does walk with assistance was sent here for further evaluation       Home Medications Prior to Admission medications   Medication Sig Start Date End Date Taking? Authorizing Provider  amLODipine (NORVASC) 5 MG tablet Take 1 tablet (5 mg total) by mouth daily. 01/16/17   Doreatha Lew, MD  losartan (COZAAR) 100 MG tablet Take 1 tablet (100 mg total) by mouth daily. 01/16/17   Doreatha Lew, MD      Allergies    Codeine    Review of Systems   Review of Systems  All other systems reviewed and are negative.   Physical Exam Updated Vital Signs There were no vitals taken for this visit. Physical Exam Vitals and nursing note reviewed.  Constitutional:      General: She is not in acute distress.    Appearance: Normal appearance. She is well-developed. She is not toxic-appearing.  HENT:     Head: Normocephalic and atraumatic.  Eyes:     General: Lids are normal.     Conjunctiva/sclera: Conjunctivae normal.     Pupils: Pupils are equal, round, and reactive to light.  Neck:     Thyroid: No thyroid mass.     Trachea: No tracheal deviation.  Cardiovascular:     Rate and Rhythm: Normal rate and regular rhythm.     Heart sounds: Normal heart sounds. No murmur heard.    No gallop.  Pulmonary:     Effort: Pulmonary effort is normal. No respiratory distress.     Breath sounds:  Normal breath sounds. No stridor. No decreased breath sounds, wheezing, rhonchi or rales.  Abdominal:     General: There is no distension.     Palpations: Abdomen is soft.     Tenderness: There is no abdominal tenderness. There is no rebound.  Musculoskeletal:        General: No tenderness. Normal range of motion.     Cervical back: Normal range of motion and neck supple.       Legs:     Comments: Limited range of motion at the left hip.  No shortening or rotation  Skin:    General: Skin is warm and dry.     Findings: No abrasion or rash.  Neurological:     Mental Status: She is alert and oriented to person, place, and time. Mental status is at baseline.     GCS: GCS eye subscore is 4. GCS verbal subscore is 5. GCS motor subscore is 6.     Cranial Nerves: No cranial nerve deficit.     Sensory: No sensory deficit.     Motor: Motor function is intact.  Psychiatric:        Attention and Perception: Attention normal.        Speech: Speech normal.  Behavior: Behavior normal.     ED Results / Procedures / Treatments   Labs (all labs ordered are listed, but only abnormal results are displayed) Labs Reviewed - No data to display  EKG None  Radiology No results found.  Procedures Procedures    Medications Ordered in ED Medications - No data to display  ED Course/ Medical Decision Making/ A&P                           Medical Decision Making Amount and/or Complexity of Data Reviewed Labs: ordered. Radiology: ordered.  Risk Prescription drug management.   Patient here after fall.  X-rays are pending and sent at the next provider        Final Clinical Impression(s) / ED Diagnoses Final diagnoses:  None    Rx / DC Orders ED Discharge Orders     None         Lacretia Leigh, MD 03/22/22 [phone]

## 2022-03-11 NOTE — ED Triage Notes (Signed)
BIBA Per EMS: Pt coming from [facility] W/ c/o fall this morning. Pt c/o Left hip pain. No obvious deformity. Upon xray - found fracture in L hip  At baseline per staff  150/64 68HR  16RR 97% RA 97.9 Temp

## 2022-03-11 NOTE — Discharge Instructions (Addendum)
Use the pain medication as needed but you can stand and pivot or bear weight as tolerated.  It is fine for you to be in your wheelchair and move around.  It would be best for you to have assistance with standing for the next few days until you are more steady on your feet.  While you are taking the pain medicine it may cause you to at times feel confused or sleepy.  If you are having to take it regularly you need to take a stool softener.

## 2022-03-11 NOTE — ED Provider Notes (Signed)
I assumed care from Dr. Zenia Resides at 4:30 PM.  Patient with a fall today and a left hip arthroplasty in anatomic alignment with acute periprosthetic fracture of the proximal femoral diaphysis.  Dr. Alvan Dame was consulted he reviewed the patient's images and reports that patient can be weight-bear as tolerated and follow-up with orthopedics in 1 month.  She does not need any bracing or other devices.  She will need pain control.  Patient is in a wheelchair all the time per her daughter but she does stand and pivot.  Patient does live at [facility] and can have assistance.  I independently interpreted patient's labs today and her CBC, CMP and urine are all within normal limits.  Patient was given hydrocodone here.  She appears comfortable.  Will attempt to have the patient's stand and pivot to a wheelchair.  Patient was able to get into the wheelchair without any difficulty.  She appears comfortable and in no significant pain.  She was discharged back to her facility.   Blanchie Dessert, MD 03/11/22 [phone]
